# Patient Record
Sex: Female | Born: 1990 | Race: White | Hispanic: No | Marital: Single | Smoking: Never smoker
Health system: Southern US, Community
[De-identification: ages and names within clinical notes are randomized; demographics above are authoritative.]

## PROBLEM LIST (undated history)

## (undated) ENCOUNTER — Emergency Department (HOSPITAL_COMMUNITY)

## (undated) DIAGNOSIS — IMO0002 Reserved for concepts with insufficient information to code with codable children: Secondary | ICD-10-CM

## (undated) DIAGNOSIS — G43909 Migraine, unspecified, not intractable, without status migrainosus: Secondary | ICD-10-CM

## (undated) DIAGNOSIS — R160 Hepatomegaly, not elsewhere classified: Secondary | ICD-10-CM

## (undated) DIAGNOSIS — N200 Calculus of kidney: Secondary | ICD-10-CM

## (undated) HISTORY — DX: Migraine, unspecified, not intractable, without status migrainosus: G43.909

## (undated) HISTORY — DX: Reserved for concepts with insufficient information to code with codable children: IMO0002

## (undated) HISTORY — DX: Calculus of kidney: N20.0

## (undated) HISTORY — DX: Hepatomegaly, not elsewhere classified: R16.0

---
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
## 2012-01-19 ENCOUNTER — Emergency Department
Admission: EM | Admit: 2012-01-19 | Discharge: 2012-01-19 | Disposition: A | Discharge status: Home / Self Care | Payer: BC Managed Care – PPO | Source: Home / Self Care | Attending: Family Medicine | Admitting: Family Medicine

## 2012-01-19 ENCOUNTER — Encounter: Payer: Self-pay | Admitting: *Deleted

## 2012-01-19 DIAGNOSIS — N760 Acute vaginitis: Secondary | ICD-10-CM

## 2012-01-19 DIAGNOSIS — B9689 Other specified bacterial agents as the cause of diseases classified elsewhere: Secondary | ICD-10-CM

## 2012-01-19 DIAGNOSIS — N949 Unspecified condition associated with female genital organs and menstrual cycle: Secondary | ICD-10-CM

## 2012-01-19 DIAGNOSIS — A499 Bacterial infection, unspecified: Secondary | ICD-10-CM

## 2012-01-19 LAB — POCT URINALYSIS DIPSTICK
Bilirubin, UA: NEGATIVE
Blood, UA: NEGATIVE
Glucose, UA: NEGATIVE
Ketones, UA: NEGATIVE
Spec Grav, UA: 1.02 (ref 1.005–1.03)
Urobilinogen, UA: 0.2 (ref 0–1)

## 2012-01-19 LAB — POCT URINE PREGNANCY: Preg Test, Ur: NEGATIVE

## 2012-01-19 MED ORDER — METRONIDAZOLE 500 MG PO TABS: 500.0000 mg | ORAL_TABLET | Freq: Two times a day (BID) | ORAL | Status: AC

## 2012-01-19 MED ORDER — FLUCONAZOLE 150 MG PO TABS: ORAL_TABLET | ORAL | Status: DC

## 2012-01-19 NOTE — ED Provider Notes (Signed)
History     CSN: [id number]  Arrival date & time 01/19/12  1526   First MD Initiated Contact with Patient 01/19/12 1631      Chief Complaint  Patient presents with  . Foreign Body in Vagina      HPI Comments: Patient is concerned that she may have a retained tampon in vagina for two days.  Patient's last menstrual period was 01/17/2012.  She noted a small amount of vaginal discharge prior to period.  She denies abdominal or pelvic pain.  She has had no fever but had mild chills yesterday.  No rash.  No nausea/vomiting   The history is provided by the patient.    Past Medical History  Diagnosis Date  . Nephrolithiasis     History reviewed. No pertinent past surgical history.  Family History  Problem Relation Age of Onset  . Hypertension Mother     History  Substance Use Topics  . Smoking status: Never Smoker   . Smokeless tobacco: Never Used  . Alcohol Use: No    OB History    Grav Para Term Preterm Abortions TAB SAB Ect Mult Living   0 0              Review of Systems No sore throat No cough No pleuritic pain No wheezing No nasal congestion No post-nasal drainage No sinus pain/pressure No itchy/red eyes No earache No hemoptysis No SOB No fever, + chills yesterday No nausea No vomiting No abdominal pain No diarrhea No urinary symptoms No skin rashes No fatigue No myalgias No headache Slight vaginal discharge has been present  Allergies  Sulfonamide derivatives  Home Medications   Current Outpatient Rx  Name Route Sig Dispense Refill  . FLUCONAZOLE 150 MG PO TABS  Take one tab by mouth as a single dose 1 tablet 0  . METRONIDAZOLE 500 MG PO TABS Oral Take 1 tablet (500 mg total) by mouth 2 (two) times daily. 14 tablet 0  . NORGESTIMATE-ETH ESTRADIOL 0.25-35 MG-MCG PO TABS Oral Take 1 tablet by mouth daily. 1 Package 11    BP 110/72  Pulse 81  Temp(Src) 98.9 F (37.2 C) (Oral)  Resp 14  Ht 5\' 4"  (1.626 m)  Wt 147 lb (66.679 kg)  BMI  25.23 kg/m2  SpO2 99%  LMP 01/17/2012  Physical Exam Nursing notes and Vital Signs reviewed. Appearance:  Patient appears healthy, stated age, and in no acute distress Eyes:  Pupils are equal, round, and reactive to light and accomodation.  Extraocular movement is intact.  Conjunctivae are not inflamed  Pharynx:  Normal Neck:  Supple.  No adenopathy Lungs:  Clear to auscultation.  Breath sounds are equal.  Heart:  Regular rate and rhythm without murmurs, rubs, or gallops.  Abdomen:  Nontender without masses or hepatosplenomegaly.  Bowel sounds are present.  No CVA or flank tenderness.  Extremities:  No edema.   Skin:  No rash present. Genitourinary:  Vulva appears normal without lesions or erythema.  Vagina has normal mucosae without lesions.  There is no tampon or other foreign body within the vagina.  There is a small amount of thin white discharge in the vaginal vault.  Cervix appears normal without lesions.  There is a small amount of clear mucous present in the cervical os.  No cervical motion tenderness is present. Uterus is small and non-tender.  Adnexae are without masses.  Adnexae are slightly tender to manipulation.   ED Course  Procedures none   Labs  Reviewed  POCT URINALYSIS DIPSTICK negative  POCT URINE PREGNANCY negative  GC/CHLAMYDIA PROBE AMP, GENITAL pending  POCT WET + KOH PREP (DR PERFORMED @ [facility]) moderate clue cells, 5-7 WBC/HPF, few yeast cells      1. Bacterial vaginosis       MDM  No evidence toxic shock syndrome (note no foreign body in vagina) Begin Flagyl for one week.  Some branching yeast cells noted; will give Rx for Diflucan 150mg . GC/chlamydia pending. follow-up with PCP if not improving, or if symptoms worsen.        Donna Christen, MD 01/21/12 2117

## 2012-01-19 NOTE — Discharge Instructions (Signed)
Bacterial Vaginosis Bacterial vaginosis (BV) is a vaginal infection where the normal balance of bacteria in the vagina is disrupted. The normal balance is then replaced by an overgrowth of certain bacteria. There are several different kinds of bacteria that can cause BV. BV is the most common vaginal infection in women of childbearing age. CAUSES   The cause of BV is not fully understood. BV develops when there is an increase or imbalance of harmful bacteria.   Some activities or behaviors can upset the normal balance of bacteria in the vagina and put women at increased risk including:   Having a new sex partner or multiple sex partners.   Douching.   Using an intrauterine device (IUD) for contraception.   It is not clear what role sexual activity plays in the development of BV. However, women that have never had sexual intercourse are rarely infected with BV.  Women do not get BV from toilet seats, bedding, swimming pools or from touching objects around them.  SYMPTOMS   Grey vaginal discharge.   A fish-like odor with discharge, especially after sexual intercourse.   Itching or burning of the vagina and vulva.   Burning or pain with urination.   Some women have no signs or symptoms at all.  DIAGNOSIS  Your caregiver must examine the vagina for signs of BV. Your caregiver will perform lab tests and look at the sample of vaginal fluid through a microscope. They will look for bacteria and abnormal cells (clue cells), a pH test higher than 4.5, and a positive amine test all associated with BV.  RISKS AND COMPLICATIONS   Pelvic inflammatory disease (PID).   Infections following gynecology surgery.   Developing HIV.   Developing herpes virus.  TREATMENT  Sometimes BV will clear up without treatment. However, all women with symptoms of BV should be treated to avoid complications, especially if gynecology surgery is planned. Female partners generally do not need to be treated. However,  BV may spread between female sex partners so treatment is helpful in preventing a recurrence of BV.   BV may be treated with antibiotics. The antibiotics come in either pill or vaginal cream forms. Either can be used with nonpregnant or pregnant women, but the recommended dosages differ. These antibiotics are not harmful to the baby.   BV can recur after treatment. If this happens, a second round of antibiotics will often be prescribed.   Treatment is important for pregnant women. If not treated, BV can cause a premature delivery, especially for a pregnant woman who had a premature birth in the past. All pregnant women who have symptoms of BV should be checked and treated.   For chronic reoccurrence of BV, treatment with a type of prescribed gel vaginally twice a week is helpful.  HOME CARE INSTRUCTIONS   Finish all medication as directed by your caregiver.   Do not have sex until treatment is completed.   Tell your sexual partner that you have a vaginal infection. They should see their caregiver and be treated if they have problems, such as a mild rash or itching.   Practice safe sex. Use condoms. Only have 1 sex partner.  PREVENTION  Basic prevention steps can help reduce the risk of upsetting the natural balance of bacteria in the vagina and developing BV:  Do not have sexual intercourse (be abstinent).   Do not douche.   Use all of the medicine prescribed for treatment of BV, even if the signs and symptoms go away.     Tell your sex partner if you have BV. That way, they can be treated, if needed, to prevent reoccurrence.  SEEK MEDICAL CARE IF:   Your symptoms are not improving after 3 days of treatment.   You have increased discharge, pain, or fever.  MAKE SURE YOU:   Understand these instructions.   Will watch your condition.   Will get help right away if you are not doing well or get worse.  FOR MORE INFORMATION  Division of STD Prevention (DSTDP), Centers for Disease  Control and Prevention: www.cdc.gov/std American Social Health Association (ASHA): www.ashastd.org  Document Released: 11/15/2005 Document Revised: 07/28/2011 Document Reviewed: 05/08/2009 Exit[employer] Patient Information 2012 [employer]. 

## 2012-01-19 NOTE — ED Notes (Addendum)
Patient believes she may have a tampon in from 2 days ago. She started her cycle 2 days ago (01/17/12), she has not had much menstruating since. When she wipes "it looks like old blood". C/o mild pelvic cramping and clots in her urine.

## 2012-01-21 LAB — GC/CHLAMYDIA PROBE AMP, GENITAL
Chlamydia, DNA Probe: NEGATIVE
GC Probe Amp, Genital: NEGATIVE

## 2012-05-12 ENCOUNTER — Other Ambulatory Visit: Payer: Self-pay | Admitting: Family Medicine

## 2012-05-12 ENCOUNTER — Encounter: Payer: Self-pay | Admitting: Family Medicine

## 2012-05-12 ENCOUNTER — Ambulatory Visit (INDEPENDENT_AMBULATORY_CARE_PROVIDER_SITE_OTHER): Payer: BC Managed Care – PPO | Admitting: Family Medicine

## 2012-05-12 VITALS — BP 104/63 | HR 82 | Temp 98.1°F | Ht 61.0 in | Wt 145.0 lb

## 2012-05-12 DIAGNOSIS — L678 Other hair color and hair shaft abnormalities: Secondary | ICD-10-CM

## 2012-05-12 DIAGNOSIS — L738 Other specified follicular disorders: Secondary | ICD-10-CM

## 2012-05-12 DIAGNOSIS — L739 Follicular disorder, unspecified: Secondary | ICD-10-CM

## 2012-05-12 DIAGNOSIS — L0291 Cutaneous abscess, unspecified: Secondary | ICD-10-CM

## 2012-05-12 MED ORDER — MUPIROCIN 2 % EX OINT: TOPICAL_OINTMENT | Freq: Three times a day (TID) | CUTANEOUS | Status: AC

## 2012-05-12 MED ORDER — DOXYCYCLINE HYCLATE 100 MG PO TABS: 100.0000 mg | ORAL_TABLET | Freq: Two times a day (BID) | ORAL | Status: AC

## 2012-05-12 NOTE — Progress Notes (Signed)
  Subjective:    Patient ID: Sierra Mccullough, female    DOB: [birth year], 21 y.o.   MRN: [id number]  HPI Had 2 bumps that lasted 2 months.  Says hasn't shaved bc didn't want to spread them.  They popped by themselves and drained a lot of pus. Has 2 new one.  Noticed a new on on her hip, leg and inner labia. She does have a prior history of abscess and staph infections. She has not used Hibiclens and a long time. She has been applying mupirocin ointment to the lesions. She quits shaving her pubic area to try to reduce the number of bumps.   Review of Systems     Objective:   Physical Exam  Constitutional: She appears well-developed and well-nourished.  Musculoskeletal: She exhibits no edema.  Skin:       She has a folliculitis on her inner left knee. She has what looks like a healing abscess on her right anterior hip. She has a couple smaller lesions in the area that appear to be folliculitis. She also has 2 lesions towards the left of the mons pubis that appear to be healing abscess these. She has a new small abscess on the right inner labia. No evidence of cellulitis.          Assessment & Plan:  Folliculitis and small abscess these-will tx with doxy x 10 days. Discussed restarting the hibiclens washes daily for one week and then twice a week for maintenance.  Warm compresses or hot bath soaks. If lesion near the labia is not resolving come in and we can lance it.  Also discussed continuing the he appears an appointment as needed. I did collect a culture that it will likely be negative because she's been using mupirocin ointment on the lesions. We also discussed strategies including changing her razor more frequently even boiling in hot water for 5 minutes after each use. Avoiding using bath scrub her second set in the shower and gray bacteria. Make sure using a new washcloth and towel each time she bathes. Wearing cotton underwear to avoid the increased friction and sweating that happens with  synthetic materials. Also recommend she restart the Hibiclens.

## 2012-05-12 NOTE — Patient Instructions (Signed)

## 2012-05-16 LAB — WOUND CULTURE: Gram Stain: NONE SEEN

## 2012-09-15 ENCOUNTER — Ambulatory Visit: Payer: Self-pay

## 2012-09-18 ENCOUNTER — Ambulatory Visit (INDEPENDENT_AMBULATORY_CARE_PROVIDER_SITE_OTHER): Payer: BC Managed Care – PPO

## 2012-09-18 DIAGNOSIS — Z23 Encounter for immunization: Secondary | ICD-10-CM

## 2013-03-02 ENCOUNTER — Encounter: Payer: Self-pay | Admitting: Family Medicine

## 2013-03-02 ENCOUNTER — Ambulatory Visit (INDEPENDENT_AMBULATORY_CARE_PROVIDER_SITE_OTHER): Payer: BC Managed Care – PPO | Admitting: Family Medicine

## 2013-03-02 VITALS — BP 100/63 | HR 84 | Ht 64.0 in | Wt 160.0 lb

## 2013-03-02 DIAGNOSIS — L678 Other hair color and hair shaft abnormalities: Secondary | ICD-10-CM

## 2013-03-02 DIAGNOSIS — L738 Other specified follicular disorders: Secondary | ICD-10-CM

## 2013-03-02 DIAGNOSIS — L739 Follicular disorder, unspecified: Secondary | ICD-10-CM

## 2013-03-02 DIAGNOSIS — L0390 Cellulitis, unspecified: Secondary | ICD-10-CM

## 2013-03-02 MED ORDER — CEPHALEXIN 500 MG PO CAPS: 500.0000 mg | ORAL_CAPSULE | Freq: Three times a day (TID) | ORAL | Status: DC

## 2013-03-02 MED ORDER — MUPIROCIN 2 % EX OINT: TOPICAL_OINTMENT | Freq: Three times a day (TID) | CUTANEOUS | Status: DC

## 2013-03-02 NOTE — Patient Instructions (Addendum)
Call if not in a week.

## 2013-03-02 NOTE — Progress Notes (Signed)
  Subjective:    Patient ID: Sierra Mccullough, female    DOB: Jun 10, 1991, 22 y.o.   MRN: [id number]  HPI Abscess x 1 week.   Has several lesion on the right lower abdomen. No drainage.  No blisters.  Has about 6 lesions. No fever or chills. It is tender.    Review of Systems     Objective:   Physical Exam  Constitutional: She appears well-developed and well-nourished.  HENT:  Head: Normocephalic and atraumatic.  Skin: Skin is warm and dry.  Several erythematous lesion that are papular and 2 that  Look more ulcerate (though she says the Band-Aid did pull the skin off of those lesions) in the right lower groin crease.   Psychiatric: She has a normal mood and affect. Her behavior is normal.          Assessment & Plan:  Folliculitis with maybe early cellulitis. It actually clustered a most like a herpes virus but she says that they were not blistery. Right now I see no active drainage. Will treat with oral Keflex and topical Bactroban ointment as she does have multiple lesions. Call if not better in one week.

## 2013-04-08 ENCOUNTER — Encounter (HOSPITAL_BASED_OUTPATIENT_CLINIC_OR_DEPARTMENT_OTHER): Payer: Self-pay | Admitting: *Deleted

## 2013-04-08 ENCOUNTER — Emergency Department (HOSPITAL_BASED_OUTPATIENT_CLINIC_OR_DEPARTMENT_OTHER)
Admission: EM | Admit: 2013-04-08 | Discharge: 2013-04-08 | Disposition: A | Discharge status: Home / Self Care | Payer: BC Managed Care – PPO | Attending: Emergency Medicine | Admitting: Emergency Medicine

## 2013-04-08 DIAGNOSIS — Z792 Long term (current) use of antibiotics: Secondary | ICD-10-CM | POA: Insufficient documentation

## 2013-04-08 DIAGNOSIS — Z79899 Other long term (current) drug therapy: Secondary | ICD-10-CM | POA: Insufficient documentation

## 2013-04-08 DIAGNOSIS — L02211 Cutaneous abscess of abdominal wall: Secondary | ICD-10-CM

## 2013-04-08 DIAGNOSIS — Z87442 Personal history of urinary calculi: Secondary | ICD-10-CM | POA: Insufficient documentation

## 2013-04-08 DIAGNOSIS — L02219 Cutaneous abscess of trunk, unspecified: Secondary | ICD-10-CM | POA: Insufficient documentation

## 2013-04-08 MED ORDER — DOXYCYCLINE HYCLATE 100 MG PO TABS
100.0000 mg | ORAL_TABLET | Freq: Once | ORAL | Status: AC
  Administered 2013-04-08: 100 mg via ORAL
  Filled 2013-04-08: qty 1

## 2013-04-08 MED ORDER — DOXYCYCLINE HYCLATE 100 MG PO CAPS: 100.0000 mg | ORAL_CAPSULE | Freq: Two times a day (BID) | ORAL | Status: DC

## 2013-04-08 NOTE — ED Notes (Signed)
Pt states she has a raised reddened area below her umbilicus that has been there for a few weeks.

## 2013-04-08 NOTE — ED Provider Notes (Signed)
History     CSN: [id number]  Arrival date & time 04/08/13  0021   First MD Initiated Contact with Patient 04/08/13 0146      Chief Complaint  Patient presents with  . Abscess    (Consider location/radiation/quality/duration/timing/severity/associated sxs/prior treatment) HPI This is a 22 year old female with a tender, erythematous, swollen area inferior and to the left of her umbilicus. It began yesterday. There is moderate pain associated with it, worse with palpation. She has attempted to take Keflex for this but states that it upsets her stomach. She denies fever or chills. She does have a history of abscesses in the past.  Past Medical History  Diagnosis Date  . Nephrolithiasis     History reviewed. No pertinent past surgical history.  Family History  Problem Relation Age of Onset  . Hypertension Mother     History  Substance Use Topics  . Smoking status: Never Smoker   . Smokeless tobacco: Never Used  . Alcohol Use: No    OB History   Grav Para Term Preterm Abortions TAB SAB Ect Mult Living   0 0              Review of Systems  All other systems reviewed and are negative.    Allergies  Sulfonamide derivatives  Home Medications   Current Outpatient Rx  Name  Route  Sig  Dispense  Refill  . cephALEXin (KEFLEX) 500 MG capsule   Oral   Take 1 capsule (500 mg total) by mouth 3 (three) times daily.   30 capsule   0   . mupirocin ointment (BACTROBAN) 2 %   Topical   Apply topically 3 (three) times daily.   22 g   0     BP 132/68  Pulse 62  Temp(Src) 98.6 F (37 C) (Oral)  Resp 18  Ht 5\' 4"  (1.626 m)  Wt 160 lb (72.576 kg)  BMI 27.45 kg/m2  SpO2 98%  LMP 03/11/2013  Physical Exam General: Well-developed, well-nourished female in no acute distress; appearance consistent with age of record HENT: normocephalic, atraumatic Eyes: pupils equal round and reactive to light; extraocular muscles intact Neck: supple Heart: regular rate and  rhythm Lungs: clear to auscultation bilaterally Abdomen: soft; nondistended Extremities: No deformity; full range of motion; pulses normal Neurologic: Awake, alert and oriented; motor function intact in all extremities and symmetric; no facial droop Skin: Warm and dry; tender, erythematous, indurated lesion just inferior to the left of the umbilicus without fluctuance Psychiatric: Normal mood and affect    ED Course  Procedures (including critical care time)     MDM  1:56 AM Iodine not indicated at this time due to lack of a palpable pus pocket. We will treat with doxycycline and have her return in 48 hours for I&D if symptoms are worsening.        Hanley Seamen, MD 04/08/13 0157

## 2013-04-16 ENCOUNTER — Emergency Department (HOSPITAL_BASED_OUTPATIENT_CLINIC_OR_DEPARTMENT_OTHER)
Admission: EM | Admit: 2013-04-16 | Discharge: 2013-04-17 | Disposition: A | Discharge status: Home / Self Care | Payer: BC Managed Care – PPO | Attending: Emergency Medicine | Admitting: Emergency Medicine

## 2013-04-16 ENCOUNTER — Encounter (HOSPITAL_BASED_OUTPATIENT_CLINIC_OR_DEPARTMENT_OTHER): Payer: Self-pay | Admitting: *Deleted

## 2013-04-16 DIAGNOSIS — Z87442 Personal history of urinary calculi: Secondary | ICD-10-CM | POA: Insufficient documentation

## 2013-04-16 DIAGNOSIS — L0291 Cutaneous abscess, unspecified: Secondary | ICD-10-CM | POA: Insufficient documentation

## 2013-04-16 LAB — URINALYSIS, ROUTINE W REFLEX MICROSCOPIC
Glucose, UA: NEGATIVE mg/dL
Hgb urine dipstick: NEGATIVE
Ketones, ur: NEGATIVE mg/dL
pH: 6.5 (ref 5.0–8.0)

## 2013-04-16 LAB — URINE MICROSCOPIC-ADD ON

## 2013-04-16 MED ORDER — DOXYCYCLINE HYCLATE 100 MG PO CAPS: 100.0000 mg | ORAL_CAPSULE | Freq: Two times a day (BID) | ORAL | Status: DC

## 2013-04-16 MED ORDER — ONDANSETRON 4 MG PO TBDP: 4.0000 mg | ORAL_TABLET | Freq: Three times a day (TID) | ORAL | Status: DC | PRN

## 2013-04-16 MED ORDER — CEFTRIAXONE SODIUM 1 G IJ SOLR
1.0000 g | Freq: Once | INTRAMUSCULAR | Status: AC
  Administered 2013-04-16: 1 g via INTRAMUSCULAR
  Filled 2013-04-16: qty 10

## 2013-04-16 MED ORDER — CEPHALEXIN 500 MG PO CAPS: 500.0000 mg | ORAL_CAPSULE | Freq: Four times a day (QID) | ORAL | Status: DC

## 2013-04-16 MED ORDER — LIDOCAINE HCL (PF) 1 % IJ SOLN
INTRAMUSCULAR | Status: AC
  Administered 2013-04-16: 2.1 mL
  Filled 2013-04-16: qty 5

## 2013-04-16 MED ORDER — MUPIROCIN CALCIUM 2 % EX CREA: TOPICAL_CREAM | Freq: Three times a day (TID) | CUTANEOUS | Status: DC

## 2013-04-16 NOTE — ED Provider Notes (Signed)
History     CSN: [id number]  Arrival date & time 04/16/13  2106   First MD Initiated Contact with Patient 04/16/13 2229      Chief Complaint  Patient presents with  . Rash    (Consider location/radiation/quality/duration/timing/severity/associated sxs/prior treatment) Patient is a 22 y.o. female presenting with rash. The history is provided by the patient. No language interpreter was used.  Rash Location: abdomen. Quality: draining, painful and redness   Pain details:    Quality:  Aching Severity:  Moderate Onset quality:  Gradual Timing:  Constant Progression:  Worsening Chronicity:  New Relieved by:  Nothing Ineffective treatments:  None tried Pt complains of a persistant abscessed area to mid abdomen.  Pt stuck a pin in and drained today.  Area is open and draining.  Pt has only been taking one doxycycline a day  Past Medical History  Diagnosis Date  . Nephrolithiasis     History reviewed. No pertinent past surgical history.  Family History  Problem Relation Age of Onset  . Hypertension Mother     History  Substance Use Topics  . Smoking status: Never Smoker   . Smokeless tobacco: Never Used  . Alcohol Use: No    OB History   Grav Para Term Preterm Abortions TAB SAB Ect Mult Living   0 0              Review of Systems  Skin: Positive for wound.  All other systems reviewed and are negative.    Allergies  Sulfonamide derivatives  Home Medications   Current Outpatient Rx  Name  Route  Sig  Dispense  Refill  . doxycycline (VIBRAMYCIN) 100 MG capsule   Oral   Take 1 capsule (100 mg total) by mouth 2 (two) times daily. One po bid x 7 days   14 capsule   0   . mupirocin ointment (BACTROBAN) 2 %   Topical   Apply topically 3 (three) times daily.   22 g   0   . cephALEXin (KEFLEX) 500 MG capsule   Oral   Take 1 capsule (500 mg total) by mouth 3 (three) times daily.   30 capsule   0     BP 130/66  Pulse 80  Temp(Src) 98.7 F (37.1 C)  (Oral)  Resp 20  Ht 5\' 4"  (1.626 m)  Wt 160 lb (72.576 kg)  BMI 27.45 kg/m2  SpO2 100%  LMP 03/11/2013  Physical Exam  Nursing note and vitals reviewed. Constitutional: She is oriented to person, place, and time. She appears well-developed and well-nourished.  HENT:  Head: Normocephalic.  Musculoskeletal: Normal range of motion.  Neurological: She is alert and oriented to person, place, and time. She has normal reflexes.  Draining abscess mid abdomen,  2cm area of erythema  Skin: There is erythema.  Psychiatric: She has a normal mood and affect.    ED Course  Procedures (including critical care time)  Labs Reviewed  URINALYSIS, ROUTINE W REFLEX MICROSCOPIC - Abnormal; Notable for the following:    Leukocytes, UA MODERATE (*)    All other components within normal limits  URINE MICROSCOPIC-ADD ON - Abnormal; Notable for the following:    Bacteria, UA FEW (*)    All other components within normal limits  URINE CULTURE   No results found.   No diagnosis found.    MDM  Rocephin Im.   Pt given rx for keflex, doxycycline and zofran.   Pt advised warm compresses.  See primary  for recheck in 2 days        Elson Areas, [place] 04/16/13 2319

## 2013-04-16 NOTE — ED Notes (Signed)
Pt here for rash to abdominal area, was seen here for same thing on 5/11

## 2013-04-16 NOTE — ED Notes (Signed)
PA at bedside.

## 2013-04-17 NOTE — ED Provider Notes (Signed)
Medical screening examination/treatment/procedure(s) were performed by non-physician practitioner and as supervising physician I was immediately available for consultation/collaboration.   Carleene Cooper III, MD 04/17/13 1409

## 2013-04-18 LAB — URINE CULTURE
Colony Count: NO GROWTH
Culture: NO GROWTH

## 2013-04-19 ENCOUNTER — Encounter: Payer: Self-pay | Admitting: Obstetrics and Gynecology

## 2013-04-19 ENCOUNTER — Ambulatory Visit (INDEPENDENT_AMBULATORY_CARE_PROVIDER_SITE_OTHER): Payer: BC Managed Care – PPO | Admitting: Obstetrics and Gynecology

## 2013-04-19 VITALS — BP 100/60 | Ht 63.5 in | Wt 155.0 lb

## 2013-04-19 DIAGNOSIS — L0291 Cutaneous abscess, unspecified: Secondary | ICD-10-CM

## 2013-04-19 DIAGNOSIS — Z113 Encounter for screening for infections with a predominantly sexual mode of transmission: Secondary | ICD-10-CM

## 2013-04-19 DIAGNOSIS — N946 Dysmenorrhea, unspecified: Secondary | ICD-10-CM

## 2013-04-19 DIAGNOSIS — Z01419 Encounter for gynecological examination (general) (routine) without abnormal findings: Secondary | ICD-10-CM

## 2013-04-19 DIAGNOSIS — L0390 Cellulitis, unspecified: Secondary | ICD-10-CM

## 2013-04-19 DIAGNOSIS — N943 Premenstrual tension syndrome: Secondary | ICD-10-CM

## 2013-04-19 MED ORDER — IBUPROFEN 800 MG PO TABS: 800.0000 mg | ORAL_TABLET | Freq: Three times a day (TID) | ORAL | Status: DC | PRN

## 2013-04-19 MED ORDER — NORETHIN-ETH ESTRAD-FE BIPHAS 1 MG-10 MCG / 10 MCG PO TABS: 1.0000 | ORAL_TABLET | Freq: Every day | ORAL | Status: DC

## 2013-04-19 NOTE — Progress Notes (Signed)
Patient ID: Sierra Mccullough, female   DOB: 1991/11/28, 22 y.o.   MRN: [id number]  22 y.o.   Single    Caucasian   female   G0P0   here for annual exam.   Patient has a history of painful menses.  Takes 800 mg ibuprofen 2 - 3 times a day for cramping.  Pain during intercourse.    Feels down during premenstrual period of time, worse recently.  Denis suicidal or homicidal ideation.  Took OCPs in the past.  Had hand swelling and nausea so stopped it.    History of migraines without aura.  Topamax caused kidney stones so stopped it.  Also has headaches with menses.    Taking Doxycycline for skin infection on abdomen. Seen at [facility] in [facility].  No culture.  Though it may be MRSA. Patient had greenish vaginal discharge after antibiotic therapy.  No itching.  Discharge resolved completely.    Patient's last menstrual period was 04/05/2013.          Sexually active: yes  The current method of family planning is condoms Exercising: cardio and free weights Last tetanus - about one year ago.     Family History  Problem Relation Age of Onset  . Hypertension Mother   . Diabetes Mother   . Migraines Mother   . Diabetes Maternal Grandfather     Patient Active Problem List   Diagnosis Date Noted  . History of nephrolithiasis 11/10/2011  . Menorrhagia 11/10/2011  . ABSCESS, SKIN 01/29/2011  . SYNCOPE 09/21/2010    Past Medical History  Diagnosis Date  . Nephrolithiasis   . Anxiety   . Depression   . Dyspareunia   . Migraines     Past Surgical History  Procedure Laterality Date  . Urethral stent  2011    due to kidney stones    Allergies: Keflex and Sulfonamide derivatives  Current Outpatient Prescriptions  Medication Sig Dispense Refill  . doxycycline (VIBRAMYCIN) 100 MG capsule Take 1 capsule (100 mg total) by mouth 2 (two) times daily.  20 capsule  0  . mupirocin cream (BACTROBAN) 2 % Apply topically 3 (three) times daily.  15 g  0  . ondansetron (ZOFRAN ODT) 4  MG disintegrating tablet Take 1 tablet (4 mg total) by mouth every 8 (eight) hours as needed for nausea.  20 tablet  0  . cephALEXin (KEFLEX) 500 MG capsule Take 1 capsule (500 mg total) by mouth 3 (three) times daily.  30 capsule  0  . cephALEXin (KEFLEX) 500 MG capsule Take 1 capsule (500 mg total) by mouth 4 (four) times daily.  28 capsule  0  . doxycycline (VIBRAMYCIN) 100 MG capsule Take 1 capsule (100 mg total) by mouth 2 (two) times daily. One po bid x 7 days  14 capsule  0  . mupirocin ointment (BACTROBAN) 2 % Apply topically 3 (three) times daily.  22 g  0   No current facility-administered medications for this visit.    ROS: Pertinent items are noted in HPI.  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  Exam:    BP 100/60  Ht 5' 3.5" (1.613 m)  Wt 155 lb (70.308 kg)  BMI 27.02 kg/m2  LMP 04/05/2013   Wt Readings from Last 3 Encounters:  04/19/13 155 lb (70.308 kg) (84%*, Z = 1.00)  04/16/13 160 lb (72.576 kg) (87%*, Z = 1.14)  04/08/13 160 lb (72.576 kg) (87%*, Z =  1.15)   * Growth percentiles are based on CDC 2-20 Years data.     Ht Readings from Last 3 Encounters:  04/19/13 5' 3.5" (1.613 m) (38%*, Z = -0.31)  04/16/13 5\' 4"  (1.626 m) (46%*, Z = -0.11)  04/08/13 5\' 4"  (1.626 m) (46%*, Z = -0.11)   * Growth percentiles are based on CDC 2-20 Years data.    General appearance: alert, cooperative and appears stated age Head: Normocephalic, without obvious abnormality, atraumatic Neck: no adenopathy, supple, symmetrical, trachea midline and thyroid not enlarged, symmetric, no tenderness/mass/nodules Lungs: clear to auscultation bilaterally Breasts: Inspection negative, No nipple retraction or dimpling, No nipple discharge or bleeding, No axillary or supraclavicular adenopathy, Normal to palpation without dominant masses Heart: regular rate and rhythm Abdomen: soft, non-tender;  no masses,  no organomegaly Extremities: extremities normal, atraumatic,  no cyanosis or edema Skin: Skin color, texture, turgor normal.  Small open wound - resolving abscess on midline abdominal wall skin - area 7 mm.  Weeping.  Surrounded by a small area of erythema. Lymph nodes: Cervical, supraclavicular, and axillary nodes normal. No abnormal inguinal nodes palpated Neurologic: Grossly normal   Pelvic: External genitalia:  no lesions              Urethra:  normal appearing urethra with no masses, tenderness or lesions              Bartholins and Skenes: normal                 Vagina: normal appearing vagina with normal color and discharge, no lesions              Cervix: normal appearance              Pap taken: no        Bimanual Exam:  Uterus:  uterus is normal size, shape, consistency and nontender                                      Adnexa: normal adnexa in size, nontender and no masses                                      Rectovaginal: Confirms                                      Anus:  normal sphincter tone, no lesions  A: normal gynecologic exam Skin abscess.  On doxycycline now, 2nd course. Menstrual headaches Dysmenorrhea Need for STD testing PMS Need for reliable contraception     P: mammogram age 14 Wound culture sent Self breast exam taught Start LoloEstrin continuously for 9 weeks, then have withdrawal.  Will observe for headaches.  Proper use, risks and benefits discussed. Ibuprofen 800 mg Rx.  See EPIC orders. GC/CT, HIV, RPR, Hep C aby, HBsAg Condoms for STD prevention Discussed Gardasil vaccine.  Patient will consider. pap smear age 22 years old Follow up in 3 months.  return annually or prn     An After Visit Summary was printed and given to the patient.

## 2013-04-19 NOTE — Patient Instructions (Addendum)
EXERCISE AND DIET:  We recommended that you start or continue a regular exercise program for good health. Regular exercise means any activity that makes your heart beat faster and makes you sweat.  We recommend exercising at least 30 minutes per day at least 3 days a week, preferably 4 or 5.  We also recommend a diet low in fat and sugar.  Inactivity, poor dietary choices and obesity can cause diabetes, heart attack, stroke, and kidney damage, among others.    ALCOHOL AND SMOKING:  Women should limit their alcohol intake to no more than 7 drinks/beers/glasses of wine (combined, not each!) per week. Moderation of alcohol intake to this level decreases your risk of breast cancer and liver damage. And of course, no recreational drugs are part of a healthy lifestyle.  And absolutely no smoking or even second hand smoke. Most people know smoking can cause heart and lung diseases, but did you know it also contributes to weakening of your bones? Aging of your skin?  Yellowing of your teeth and nails?  CALCIUM AND VITAMIN D:  Adequate intake of calcium and Vitamin D are recommended.  The recommendations for exact amounts of these supplements seem to change often, but generally speaking 600 mg of calcium (either carbonate or citrate) and 800 units of Vitamin D per day seems prudent. Certain women may benefit from higher intake of Vitamin D.  If you are among these women, your doctor will have told you during your visit.    PAP SMEARS:  Pap smears, to check for cervical cancer or precancers,  have traditionally been done yearly, although recent scientific advances have shown that most women can have pap smears less often.  However, every woman still should have a physical exam from her gynecologist every year. It will include a breast check, inspection of the vulva and vagina to check for abnormal growths or skin changes, a visual exam of the cervix, and then an exam to evaluate the size and shape of the uterus and  ovaries.  And after 20 years of age, a rectal exam is indicated to check for rectal cancers. We will also provide age appropriate advice regarding health maintenance, like when you should have certain vaccines, screening for sexually transmitted diseases, bone density testing, colonoscopy, mammograms, etc.   MAMMOGRAMS:  All women over 20 years old should have a yearly mammogram. Many facilities now offer a "3D" mammogram, which may cost around $50 extra out of pocket. If possible,  we recommend you accept the option to have the 3D mammogram performed.  It both reduces the number of women who will be called back for extra views which then turn out to be normal, and it is better than the routine mammogram at detecting truly abnormal areas.    COLONOSCOPY:  Colonoscopy to screen for colon cancer is recommended for all women at age 20.  We know, you hate the idea of the prep.  We agree, BUT, having colon cancer and not knowing it is worse!!  Colon cancer so often starts as a polyp that can be seen and removed at colonscopy, which can quite literally save your life!  And if your first colonoscopy is normal and you have no family history of colon cancer, most women don't have to have it again for 10 years.  Once every ten years, you can do something that may end up saving your life, right?  We will be happy to help you get it scheduled when you are ready.    Be sure to check your insurance coverage so you understand how much it will cost.  It may be covered as a preventative service at no cost, but you should check your particular policy.     Oral Contraception Information Oral contraceptives (OCs) are medicines taken to prevent pregnancy. OCs work by preventing the ovaries from releasing eggs. The hormones in OCs also cause the cervical mucus to thicken, preventing the sperm from entering the uterus. The hormones also cause the uterine lining to become thin, not allowing a fertilized egg to attach to the inside of  the uterus. OCs are highly effective when taken exactly as prescribed. However, OCs do not prevent sexually transmitted diseases (STDs). Safe sex practices, such as using condoms along with the pill, can help prevent STDs.  Before taking the pill, you may have a physical exam and Pap test. Your caregiver may order blood tests that may be necessary. Your caregiver will make sure you are a good candidate for oral contraception. Discuss with your caregiver the possible side effects of the OC you may be prescribed. When starting an OC, it can take 2 to 3 months for the body to adjust to the changes in hormone levels in your body.  TYPES OF ORAL CONTRACEPTION  The combination pill. This pill contains estrogen and progestin (synthetic progesterone) hormones. The combination pill comes in either 21-day or 28-day packs. With 21-day packs, you do not take pills for 7 days after the last pill. With 28-day packs, the pill is taken every day. The last 7 pills are without hormones. Certain types of pills have more than 21 hormone-containing pills.  The minipill. This pill contains the progesterone hormone only. It is taken every day continuously. The minipill comes in packs of 91 pills. The first 84 pills contain the hormones, and the last 7 pills do not. The last 7 days are when you will have your menstrual period. You may experience irregular spotting. ADVANTAGES  Decreases premenstrual symptoms.  Treats menstrual period cramps.  Regulates the menstrual cycle.  Decreases a heavy menstrual flow.  Treats acne.  Treats abnormal uterine bleeding.  Treats chronic pelvic pain.  Treats polycystic ovarian syndrome.  Treats endometriosis.  Can be used as emergency contraception. DISADVANTAGES OCs can be less effective if:  You forget to take the pill at the same time every day.  You have a stomach or intestinal disease that lessens the absorption of the pill.  You take OCs with other medicines that  make OCs less effective.  You take expired OCs.  You forget to restart the pill on day 7, when using the packs of 21 pills. Document Released: 02/05/2003 Document Revised: 02/07/2012 Document Reviewed: 03/24/2011 [facility] Patient Information 2014 [place], [place]. HPV Vaccine Questions and Answers WHAT IS HUMAN PAPILLOMAVIRUS (HPV)? HPV is a virus that can lead to cervical cancer; vulvar and vaginal cancers; penile cancer; anal cancer and genital warts (warts in the genital areas). More than 1 vaccine is available to help you or your child with protection against HPV. Your caregiver can talk to you about which one might give you the best protection. WHO SHOULD GET THIS VACCINE? The HPV vaccine is most effective when given before the onset of sexual activity.  This vaccine is recommended for girls 75 or 20 years of age. It can be given to girls as young as 20 years old.  HPV vaccine can be given to males, 9 through 20 years of age, to reduce the likelihood of acquiring  genital warts.  HPV vaccine can be given to males and females aged 44 through 26 years to prevent anal cancer. HPV vaccine is not generally recommended after age 37, because most individuals have been exposed to the HPV virus by that age. HOW EFFECTIVE IS THIS VACCINE?  The vaccine is generally effective in preventing cervical; vulvar and vaginal cancers; penile cancer; anal cancer and genital warts caused by 4 types of HPV. The vaccine is less effective in those individuals who are already infected with HPV. This vaccine does not treat existing HPV, genital warts, pre-cancers or cancers. WILL SEXUALLY ACTIVE INDIVIDUALS BENEFIT FROM THE VACCINE? Sexually active individuals may still benefit from the vaccine but may get less benefit due to previous HPV exposure. HOW AND WHEN IS THE VACCINE ADMINISTERED? The vaccine is given in a series of 3 injections (shots) over a 6 month period in both males and females. The exact timing  depends on which specific vaccine your caregiver recommends for you. IS THE HPV VACCINE SAFE?  The federal government has approved the HPV vaccine as safe and effective. This vaccine was tested in both males and females in many countries around the world. The most common side effect is soreness at the injection site. Since the drug became approved, there has been some concern about patients passing out after being vaccinated, which has led to a recommendation of a 15 minute waiting period following vaccination. This practice may decrease the small risk of passing out. Additionally there is a rare risk of anaphylaxis (an allergic reaction) to the vaccine and a risk of a blood clot among individuals with specific risk factors for a blood clot. DOES THIS VACCINE CONTAIN THIMEROSAL OR MERCURY? No. There is no thimerosal or mercury in the HPV vaccine. It is made of proteins from the outer coat of the virus (HPV). There is no infectious material in this vaccine. WILL GIRLS/WOMEN WHO HAVE BEEN VACCINATED STILL NEED CERVICAL CANCER SCREENING? Yes. There are 3 reasons why women will still need regular cervical cancer screening. First, the vaccine will NOT provide protection against all types of HPV that cause cervical cancer. Vaccinated women will still be at risk for some cancers. Second, some women may not get all required doses of the vaccine (or they may not get them at the recommended times). Therefore, they may not get the vaccine's full benefits. Third, women may not get the full benefit of the vaccine if they receive it after they have already acquired any of the 4 types of HPV. WILL THE HPV VACCINE BE COVERED BY INSURANCE PLANS? While some insurance companies may cover the vaccine, others may not. Most large group insurance plans cover the costs of recommended vaccines. WHAT KIND OF GOVERNMENT PROGRAMS MAY BE AVAILABLE TO COVER HPV VACCINE? Federal health programs such as Vaccines for Children [facility]) will  cover the HPV vaccine. The [facility] program provides free vaccines to children and adolescents under 29 years of age, who are either uninsured, Medicaid-eligible, American [place] or [place] Native. There are over 45,000 sites that provide [facility] vaccines including hospital, private and public clinics. The [facility] program also allows children and adolescents to get VFC vaccines through [facility] or [facility] if their private health insurance does not cover the vaccine. Some states also provide free or low-cost vaccines, at public health clinics, to people without health insurance coverage for vaccines. GENITAL HPV: WHY IS HPV IMPORTANT? Genital HPV is the most common virus transmitted through genital contact, most  often during vaginal and anal sex. About 40 types of HPV can infect the genital areas of men and women. While most HPV types cause no symptoms and go away on their own, some types can cause cervical cancer in women. These types also cause other less common genital cancers, including cancers of the penis, anus, vagina (birth canal), and vulva (area around the opening of the vagina). Other types of HPV can cause genital warts in men and women. HOW COMMON IS HPV?   At least 50% of sexually active people will get HPV at some time in their lives. HPV is most common in young women and men who are in their late teens and early 72s.  Anyone who has ever had genital contact with another person can get HPV. Both men and women can get it and pass it on to their sex partners without realizing it. IS HPV THE SAME THING AS HIV OR HERPES? HPV is NOT the same as HIV or Herpes (Herpes simplex virus or HSV). While these are all viruses that can be sexually transmitted, HIV and HSV do not cause the same symptoms or health problems as HPV. CAN HPV AND ITS ASSOCIATED DISEASES BE TREATED? There is no treatment for HPV. There are treatments for the health problems that HPV can cause, such as  genital warts, cervical cell changes, and cancers of the cervix (lower part of the womb), vulva, vagina and anus.  HOW IS HPV RELATED TO CERVICAL CANCER? Some types of HPV can infect a woman's cervix and cause the cells to change in an abnormal way. Most of the time, HPV goes away on its own. When HPV is gone, the cervical cells go back to normal. Sometimes, HPV does not go away. Instead, it lingers (persists) and continues to change the cells on a woman's cervix. These cell changes can lead to cancer over time if they are not treated. ARE THERE OTHER WAYS TO PREVENT CERVICAL CANCER? Regular Pap tests and follow-up can prevent most, but not all, cases of cervical cancer. Pap tests can detect cell changes (or pre-cancers) in the cervix before they turn into cancer. Pap tests can also detect most, but not all, cervical cancers at an early, curable stage. Most women diagnosed with cervical cancer have either never had a Pap test, or not had a Pap test in the last 5 years. There is also an HPV DNA test available for use with the Pap test as part of cervical cancer screening. This test may be ordered for women over 30 or for women who get an unclear (borderline) Pap test result. While this test can tell if a woman has HPV on her cervix, it cannot tell which types of HPV she has. If the HPV DNA test is negative for HPV DNA, then screening may be done every 3 years. If the HPV DNA test is positive for HPV DNA, then screening should be done every 6 to 12 months. OTHER QUESTIONS ABOUT THE HPV VACCINE WHAT HPV TYPES DOES THE VACCINE PROTECT AGAINST? The HPV vaccine protects against the HPV types that cause most (70%) cervical cancers (types 16 and 18), most (78%) anal cancers (types 16 and 18) and the two HPV types that cause most (90%) genital warts (types 6 and 11). WHAT DOES THE VACCINE NOT PROTECT AGAINST?  Because the vaccine does not protect against all types of HPV, it will not prevent all cases of cervical  cancer, anal cancer, other genital cancers or genital warts. About  30% of cervical cancers are not prevented with vaccination, so it will be important for women to continue screening for cervical cancer (regular Pap tests). Also, the vaccine does not prevent about 10% of genital warts nor will it prevent other sexually transmitted infections (STIs), including HIV. Therefore, it will still be important for sexually active adults to practice safe sex to reduce exposure to HPV and other STI's. HOW LONG DOES VACCINE PROTECTION LAST? WILL A BOOSTER SHOT BE NEEDED? So far, studies have followed women for 5 years and found that they are still protected. Currently, additional (booster) doses are not recommended. More research is being done to find out how long protection will last, and if a booster vaccine is needed years later.  WHY IS THE HPV VACCINE RECOMMENDED AT SUCH A YOUNG AGE? Ideally, males and females should get the vaccine before they are sexually active since this vaccine is most effective in individuals who have not yet acquired any of the HPV vaccine types. Individuals who have not been infected with any of the 4 types of HPV will get the full benefits of the vaccine.  SHOULD PREGNANT WOMEN BE VACCINATED? The vaccine is not recommended for pregnant women. There has been limited research looking at vaccine safety for pregnant women and their developing fetus. Studies suggest that the vaccine has not caused health problems during pregnancy, nor has it caused health problems for the infant. Pregnant women should complete their pregnancy before getting the vaccine. If a woman finds out she is pregnant after she has started getting the vaccine series, she should complete her pregnancy before finishing the 3 doses. SHOULD BREASTFEEDING MOTHERS BE VACCINATED? Mothers nursing their babies may get the vaccine because the virus is inactivated and will not harm the mother or baby. WILL INDIVIDUALS BE PROTECTED  AGAINST HPV AND RELATED DISEASES, EVEN IF THEY DO NOT GET ALL 3 DOSES? It is not yet known how much protection individuals will get from receiving only 1 or 2 doses of the vaccine. For this reason, it is very important that individuals get all 3 doses of the vaccine. WILL CHILDREN BE REQUIRED TO BE VACCINATED TO ENTER SCHOOL? There are no federal laws that require children or adolescents to get vaccinated. All school entry laws are state laws so they vary from state to state. To find out what vaccines are needed for children or adolescents to enter school in your state, check with your state health department or board of education. ARE THERE OTHER WAYS TO PREVENT HPV? The only sure way to prevent HPV is to abstain from all sexual activity. Sexually active adults can reduce their risk by being in a mutually monogamous relationship with someone who has had no other sex partners. But even individuals with only 1 lifetime sex partner can get HPV, if their partner has had a previous partner with HPV. It is unknown how much protection condoms provide against HPV, since areas that are not covered by a condom can be exposed to the virus. However, condoms may reduce the risk of genital warts and cervical cancer. They can also reduce the risk of HIV and some other sexually transmitted infections (STIs), when used consistently and correctly (all the time and the right way). Document Released: 11/15/2005 Document Revised: 02/07/2012 Document Reviewed: 07/11/2009 [facility] Patient Information 2014 [place], [place].

## 2013-04-20 LAB — GC/CHLAMYDIA PROBE AMP, URINE
Chlamydia, Swab/Urine, PCR: NEGATIVE
GC Probe Amp, Urine: NEGATIVE

## 2013-04-20 LAB — STD PANEL
HIV: NONREACTIVE
Hepatitis B Surface Ag: NEGATIVE

## 2013-04-25 ENCOUNTER — Telehealth: Payer: Self-pay

## 2013-04-25 NOTE — Telephone Encounter (Signed)
Patient notified wound culture negative.

## 2013-04-25 NOTE — Telephone Encounter (Signed)
LMOVM to call for test results. 

## 2013-04-25 NOTE — Telephone Encounter (Signed)
Message copied by Alphonsa Overall on Wed Apr 25, 2013 10:53 AM ------      Message from: Conley Simmonds      Created: Tue Apr 24, 2013  7:56 AM       Please report negative wound culture to patient.      Has already been on antibiotics for several days, so this is not unexpected.  At least MRSA did not grow. ------

## 2013-07-20 ENCOUNTER — Ambulatory Visit: Payer: Self-pay | Admitting: Obstetrics and Gynecology

## 2013-08-10 ENCOUNTER — Telehealth: Payer: Self-pay

## 2013-08-10 ENCOUNTER — Encounter: Payer: Self-pay | Admitting: *Deleted

## 2013-08-10 ENCOUNTER — Ambulatory Visit (INDEPENDENT_AMBULATORY_CARE_PROVIDER_SITE_OTHER): Payer: BC Managed Care – PPO | Admitting: Family Medicine

## 2013-08-10 VITALS — BP 109/72 | HR 70 | Wt 159.0 lb

## 2013-08-10 DIAGNOSIS — Z111 Encounter for screening for respiratory tuberculosis: Secondary | ICD-10-CM

## 2013-08-10 DIAGNOSIS — Z23 Encounter for immunization: Secondary | ICD-10-CM

## 2013-08-10 NOTE — Telephone Encounter (Signed)
error 

## 2013-08-10 NOTE — Progress Notes (Signed)
Patient was given influenza vaccine left deltoid and TB skin test was given left lower arm.  She also left a form to be completed. Form is in your basket. Rhonda Cunningham,CMA

## 2013-08-13 LAB — TB SKIN TEST: Induration: 0 mm

## 2013-08-17 ENCOUNTER — Ambulatory Visit (INDEPENDENT_AMBULATORY_CARE_PROVIDER_SITE_OTHER): Payer: BC Managed Care – PPO | Admitting: Physician Assistant

## 2013-08-17 ENCOUNTER — Encounter: Payer: Self-pay | Admitting: Physician Assistant

## 2013-08-17 VITALS — BP 109/58 | HR 85 | Wt 158.0 lb

## 2013-08-17 DIAGNOSIS — Z111 Encounter for screening for respiratory tuberculosis: Secondary | ICD-10-CM

## 2013-08-17 DIAGNOSIS — Z0184 Encounter for antibody response examination: Secondary | ICD-10-CM

## 2013-08-17 NOTE — Progress Notes (Signed)
  Subjective:    Patient ID: Sierra Mccullough, female    DOB: 12-Sep-1991, 22 y.o.   MRN: [id number]  HPI Pt presents to the clinic to get forms filled out for school and to make sure all immunizations are up to date. No problems or concerns.    Review of Systems     Objective:   Physical Exam        Assessment & Plan:  Immunity status- Ordered titer for varicella. PPD 2nd test given today. Will have read on Monday. All other vaccines confirmed. Paperwork signed.

## 2013-08-18 LAB — VARICELLA ZOSTER ANTIBODY, IGG: Varicella IgG: 201.1 Index — ABNORMAL HIGH (ref <135.00)

## 2013-08-20 LAB — TB SKIN TEST
Induration: 0 mm
TB Skin Test: NEGATIVE

## 2013-09-06 ENCOUNTER — Ambulatory Visit: Payer: Self-pay | Admitting: Sports Medicine

## 2013-09-07 ENCOUNTER — Ambulatory Visit (INDEPENDENT_AMBULATORY_CARE_PROVIDER_SITE_OTHER): Payer: BC Managed Care – PPO

## 2013-09-07 ENCOUNTER — Encounter: Payer: Self-pay | Admitting: Sports Medicine

## 2013-09-07 ENCOUNTER — Ambulatory Visit (INDEPENDENT_AMBULATORY_CARE_PROVIDER_SITE_OTHER): Payer: BC Managed Care – PPO | Admitting: Sports Medicine

## 2013-09-07 VITALS — BP 114/72 | HR 73 | Wt 163.0 lb

## 2013-09-07 DIAGNOSIS — M25519 Pain in unspecified shoulder: Secondary | ICD-10-CM

## 2013-09-07 DIAGNOSIS — M25512 Pain in left shoulder: Secondary | ICD-10-CM

## 2013-09-07 MED ORDER — NAPROXEN 500 MG PO TABS: 500.0000 mg | ORAL_TABLET | Freq: Two times a day (BID) | ORAL | Status: DC

## 2013-09-07 NOTE — Assessment & Plan Note (Signed)
Symptoms clinically represents shoulder bursitis/rotator cuff tendinitis. This is referrable predominately to the subscapularis. We will start conservatively with x-rays, NSAIDs, rotator cuff exercises. Return in one month, consider injection if no better.

## 2013-09-07 NOTE — Progress Notes (Signed)
  Subjective:    CC: Left shoulder pain  HPI: This is a pleasant, otherwise healthy 22 year old female with left shoulder pain that began three days ago. The pain is constant and worsens when lifting her arms above her shoulder and when reaching backward. It occasionally radiates to the neck. It does not radiate into her arm or fingers. It interferes with her sleep. She denies any recent trauma. She has found mild relief with ice and Tylenol.  Moderate, persistent.  Past medical history, Surgical history, Family history not pertinant except as noted below, Social history, Allergies, and medications have been entered into the medical record, reviewed, and no changes needed.   Review of Systems: No headache, visual changes, nausea, vomiting, diarrhea, constipation, dizziness, abdominal pain, skin rash, fevers, chills, night sweats, swollen lymph nodes, weight loss, chest pain, body aches, joint swelling, muscle aches, shortness of breath, mood changes, visual or auditory hallucinations.  Objective:    General: Well Developed, well nourished, and in no acute distress.  Neuro: Alert and oriented x3, extra-ocular muscles intact, sensation grossly intact.  HEENT: Normocephalic, atraumatic, pupils equal round reactive to light, neck supple, no masses, no lymphadenopathy, thyroid nonpalpable.  Skin: Warm and dry, no rashes noted.  Cardiac: Regular rate and rhythm, no murmurs rubs or gallops.  Respiratory: Clear to auscultation bilaterally. Not using accessory muscles, speaking in full sentences.  Abdominal: Soft, nontender, nondistended, positive bowel sounds, no masses, no organomegaly. . Left Shoulder: Inspection reveals no abnormalities, atrophy or asymmetry. Tender to palpation lateral to the coracoid process. ROM is full in all planes. Rotator cuff strength normal throughout. Positive Neer and Hawkin's tests, empty can sign. Speeds and Yergason's tests normal. No labral pathology noted with  negative Obrien's, negative clunk and good stability. Normal scapular function observed. No painful arc and no drop arm sign. No apprehension sign  3 views of the right shoulder were reviewed personally, they are negative.  Impression and Recommendations:    The patient was counselled, risk factors were discussed, anticipatory guidance given.  Assessment: This is a 22 year old female with rotator cuff tendinitis.  Plan: 1. Complete home physical therapy daily 2. 800mg  Ibuprofen up to 3 times daily for pain 3. Return for glucocorticoid injection in 3 weeks if pain not improved   This note was originally written by Karin Lieu MS3

## 2013-10-05 ENCOUNTER — Ambulatory Visit: Payer: Self-pay | Admitting: Sports Medicine

## 2014-04-23 ENCOUNTER — Other Ambulatory Visit: Payer: Self-pay | Admitting: Family Medicine

## 2014-04-23 DIAGNOSIS — Z8489 Family history of other specified conditions: Secondary | ICD-10-CM

## 2014-04-25 ENCOUNTER — Ambulatory Visit: Payer: BC Managed Care – PPO | Admitting: Obstetrics and Gynecology

## 2014-05-16 ENCOUNTER — Ambulatory Visit: Payer: BC Managed Care – PPO | Admitting: Obstetrics and Gynecology

## 2014-05-23 ENCOUNTER — Encounter: Payer: Self-pay | Admitting: Obstetrics and Gynecology

## 2014-05-23 ENCOUNTER — Ambulatory Visit (INDEPENDENT_AMBULATORY_CARE_PROVIDER_SITE_OTHER): Payer: BC Managed Care – PPO | Admitting: Obstetrics and Gynecology

## 2014-05-23 VITALS — BP 100/64 | HR 76 | Resp 18 | Ht 63.5 in | Wt 140.4 lb

## 2014-05-23 DIAGNOSIS — Z01419 Encounter for gynecological examination (general) (routine) without abnormal findings: Secondary | ICD-10-CM

## 2014-05-23 DIAGNOSIS — Z113 Encounter for screening for infections with a predominantly sexual mode of transmission: Secondary | ICD-10-CM

## 2014-05-23 DIAGNOSIS — N920 Excessive and frequent menstruation with regular cycle: Secondary | ICD-10-CM

## 2014-05-23 LAB — CBC
HCT: 38.9 % (ref 36.0–46.0)
Hemoglobin: 12.8 g/dL (ref 12.0–15.0)
MCH: 26.7 pg (ref 26.0–34.0)
MCHC: 32.9 g/dL (ref 30.0–36.0)
MCV: 81.2 fL (ref 78.0–100.0)
PLATELETS: 331 10*3/uL (ref 150–400)
RBC: 4.79 MIL/uL (ref 3.87–5.11)
RDW: 13.4 % (ref 11.5–15.5)
WBC: 11 10*3/uL — AB (ref 4.0–10.5)

## 2014-05-23 MED ORDER — NORETHIN ACE-ETH ESTRAD-FE 1-20 MG-MCG PO TABS: 1.0000 | ORAL_TABLET | Freq: Every day | ORAL | Status: DC

## 2014-05-23 MED ORDER — DESOGESTREL-ETHINYL ESTRADIOL 0.15-0.02/0.01 MG (21/5) PO TABS: 1.0000 | ORAL_TABLET | Freq: Every day | ORAL | Status: DC

## 2014-05-23 NOTE — Progress Notes (Signed)
Patient ID: Sierra Mccullough, female   DOB: [birth year], 23 y.o.   MRN: [id number] GYNECOLOGY VISIT  PCP:  Beatrice Lecher, MD  Referring provider:   HPI: 23 y.o.   Single  Caucasian  female   G0P0 with Patient's last menstrual period was 05/16/2014.   here for  AEX.   Cycles are heavy and with bad cramping down back and legs for the first few days.  No intermenstrual bleeding.  Having greenish bloody discharge at the end of this cycle.  Resolved.  Some left sided pain when this occurred.  One new partner since last visit.  None currently.   Can have random left sided pain.   Tried LoLoEstrin for one month last year.  Stopped due to insurance issues.   Has migraines but not with aura.  They come before menses begin.  Headaches can last during the cycle sometimes.  Had migraines in general.  Used Topamax in the past.    Hgb:   PCP Urine:  Trace Ketones  GYNECOLOGIC HISTORY: Patient's last menstrual period was 05/16/2014. Sexually active:  Not currently Partner preference: female Contraception:   abstinence Menopausal hormone therapy: n/a DES exposure: no   Blood transfusions:  no  Sexually transmitted diseases:  no  GYN procedures and prior surgeries:  none Last mammogram:   n/a              Last pap and high risk HPV testing:   n/a History of abnormal pap smear:  n/a   OB History   Grav Para Term Preterm Abortions TAB SAB Ect Mult Living   0 0               LIFESTYLE: Exercise:  Cardio and weights           Tobacco:   no Alcohol:    no Drug use:  no  OTHER HEALTH MAINTENANCE: Tetanus/TDap:  2013 Gardisil:            no Influenza:          08/2013 Zostavax:          n/a  Bone density:    n/a Colonoscopy:   n/a  Cholesterol check:  never  Family History  Problem Relation Age of Onset  . Hypertension Mother   . Diabetes Mother   . Migraines Mother   . Diabetes Maternal Grandfather     Patient Active Problem List   Diagnosis Date Noted  . Left  shoulder pain 09/07/2013  . History of nephrolithiasis 11/10/2011  . Menorrhagia 11/10/2011   Past Medical History  Diagnosis Date  . Nephrolithiasis   . Anxiety   . Depression   . Dyspareunia   . Migraines     Past Surgical History  Procedure Laterality Date  . Urethral stent  2011    due to kidney stones    ALLERGIES: Keflex and Sulfonamide derivatives  No current outpatient prescriptions on file.   No current facility-administered medications for this visit.     ROS:  Pertinent items are noted in HPI.  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   PHYSICAL EXAMINATION:    BP 100/64  Pulse 76  Resp 18  Ht 5' 3.5" (1.613 m)  Wt 140 lb 6.4 oz (63.685 kg)  BMI 24.48 kg/m2  LMP 05/16/2014   Wt Readings from Last 3 Encounters:  05/23/14 140 lb 6.4 oz (63.685 kg)  09/07/13 163 lb (73.936 kg)  08/17/13 158 lb (71.668 kg) (86%*, Z = 1.07)   * Growth percentiles are based on CDC 2-20 Years data.     Ht Readings from Last 3 Encounters:  05/23/14 5' 3.5" (1.613 m)  04/19/13 5' 3.5" (1.613 m) (38%*, Z = -0.31)  04/16/13 5\' 4"  (1.626 m) (46%*, Z = -0.11)   * Growth percentiles are based on CDC 2-20 Years data.    General appearance: alert, cooperative and appears stated age Head: Normocephalic, without obvious abnormality, atraumatic Neck: no adenopathy, supple, symmetrical, trachea midline and thyroid not enlarged, symmetric, no tenderness/mass/nodules Lungs: clear to auscultation bilaterally Breasts: Inspection negative, No nipple retraction or dimpling, No nipple discharge or bleeding, No axillary or supraclavicular adenopathy, Normal to palpation without dominant masses Heart: regular rate and rhythm Abdomen: soft, non-tender; no masses,  no organomegaly Extremities: extremities normal, atraumatic, no cyanosis or edema Skin: Skin color, texture, turgor normal. No rashes or lesions Lymph nodes: Cervical,  supraclavicular, and axillary nodes normal. No abnormal inguinal nodes palpated Neurologic: Grossly normal  Pelvic: External genitalia:  no lesions              Urethra:  normal appearing urethra with no masses, tenderness or lesions              Bartholins and Skenes: normal                 Vagina: normal appearing vagina with normal color and discharge, no lesions              Cervix: normal appearance              Pap and high risk HPV testing done: No..            Bimanual Exam:  Uterus:  uterus is normal size, shape, consistency and nontender                                      Adnexa: normal adnexa in size, nontender and no masses                                      Rectovaginal: Confirms                                      Anus:  normal sphincter tone, no lesions  ASSESSMENT  Normal gynecologic exam. Menstrual migraines. No aura.  Menorrhagia.  Desire for STD testing.   PLAN  Mammogram recommended yearly at age 76 Pap smear and high risk HPV testing age 50.  Counseled on self breast exam.  Patient taught.  Discussed Gardasil.  Will consider.  Also discussed new HPV vaccine.  STD testing. CBC. Rx for Mircette 3 packs with 3 refills.  Return annually or prn   An After Visit Summary was printed and given to the patient.

## 2014-05-23 NOTE — Patient Instructions (Signed)

## 2014-05-24 ENCOUNTER — Encounter: Payer: Self-pay | Admitting: Obstetrics and Gynecology

## 2014-05-24 LAB — STD PANEL
HIV: NONREACTIVE
Hepatitis B Surface Ag: NEGATIVE

## 2014-05-24 LAB — GC/CHLAMYDIA PROBE AMP, URINE
CHLAMYDIA, SWAB/URINE, PCR: NEGATIVE
GC PROBE AMP, URINE: NEGATIVE

## 2014-05-24 LAB — HEPATITIS C ANTIBODY: HCV AB: NEGATIVE

## 2014-07-03 ENCOUNTER — Encounter: Payer: Self-pay | Admitting: Physician Assistant

## 2014-07-03 ENCOUNTER — Ambulatory Visit (INDEPENDENT_AMBULATORY_CARE_PROVIDER_SITE_OTHER): Payer: BC Managed Care – PPO | Admitting: Physician Assistant

## 2014-07-03 VITALS — BP 102/62 | HR 80 | Temp 97.6°F | Ht 64.0 in | Wt 143.0 lb

## 2014-07-03 DIAGNOSIS — Z0000 Encounter for general adult medical examination without abnormal findings: Secondary | ICD-10-CM

## 2014-07-03 DIAGNOSIS — Z131 Encounter for screening for diabetes mellitus: Secondary | ICD-10-CM

## 2014-07-03 DIAGNOSIS — Z13220 Encounter for screening for lipoid disorders: Secondary | ICD-10-CM

## 2014-07-03 NOTE — Patient Instructions (Signed)

## 2014-07-03 NOTE — Progress Notes (Signed)
   Subjective:    Patient ID: Sierra Mccullough, female    DOB: 1991/02/21, 21 y.o.   MRN: [id number]  HPI Pt is a 23 yo female who presents to the clinic for yearly physical. She has no complaints or concerns today. 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 She is in the process of getting some immunization as [facility] where she currently works. She is unaware of which ones. She needs an overall physical for her school as well.    Review of Systems  All other systems reviewed and are negative.      Objective:   Physical Exam  Constitutional: She is oriented to person, place, and time. She appears well-developed and well-nourished.  HENT:  Head: Normocephalic and atraumatic.  Right Ear: External ear normal.  Left Ear: External ear normal.  Nose: Nose normal.  Mouth/Throat: Oropharynx is clear and moist. No oropharyngeal exudate.  Eyes: Conjunctivae and EOM are normal. Pupils are equal, round, and reactive to light. Right eye exhibits no discharge. Left eye exhibits no discharge.  Neck: Normal range of motion. Neck supple. No thyromegaly present.  Cardiovascular: Normal rate, regular rhythm and normal heart sounds.   Pulmonary/Chest: Effort normal and breath sounds normal.  Abdominal: Soft. Bowel sounds are normal. She exhibits no distension. There is no tenderness.  Musculoskeletal: Normal range of motion.  Lymphadenopathy:    She has no cervical adenopathy.  Neurological: She is alert and oriented to person, place, and time. She has normal reflexes. No cranial nerve deficit.  Skin: Skin is dry.  Psychiatric: She has a normal mood and affect. Her behavior is normal.          Assessment & Plan:  CPE- pap smear not needed until 23. Tdap up to date. Unaware of status of other immunizations. We gave her a copy of what we had. She can return to clinic with she coordinates with [facility] what they have for immunizations. Forms filled out for physical exam entry  into college. Fasting labs given for pt to have drawn.

## 2014-08-07 ENCOUNTER — Ambulatory Visit (INDEPENDENT_AMBULATORY_CARE_PROVIDER_SITE_OTHER): Payer: BC Managed Care – PPO | Admitting: Family Medicine

## 2014-08-07 VITALS — Temp 98.2°F

## 2014-08-07 DIAGNOSIS — Z23 Encounter for immunization: Secondary | ICD-10-CM | POA: Diagnosis not present

## 2014-08-07 DIAGNOSIS — Z111 Encounter for screening for respiratory tuberculosis: Secondary | ICD-10-CM | POA: Diagnosis not present

## 2014-08-07 NOTE — Progress Notes (Signed)
   Subjective:    Patient ID: Sierra Mccullough, female    DOB: Apr 30, 1991, 23 y.o.   MRN: [id number]  HPI Pt presents today for PPD and flu shot. Denies fever. Margette Fast, CMA    Review of Systems     Objective:   Physical Exam        Assessment & Plan:

## 2014-08-09 LAB — TB SKIN TEST: TB Skin Test: NEGATIVE

## 2014-09-09 ENCOUNTER — Ambulatory Visit (INDEPENDENT_AMBULATORY_CARE_PROVIDER_SITE_OTHER): Payer: BC Managed Care – PPO

## 2014-09-09 ENCOUNTER — Encounter: Payer: Self-pay | Admitting: Sports Medicine

## 2014-09-09 ENCOUNTER — Ambulatory Visit (INDEPENDENT_AMBULATORY_CARE_PROVIDER_SITE_OTHER): Payer: BLUE CROSS/BLUE SHIELD | Admitting: Sports Medicine

## 2014-09-09 VITALS — BP 108/71 | HR 91 | Ht 64.0 in | Wt 149.0 lb

## 2014-09-09 DIAGNOSIS — R202 Paresthesia of skin: Secondary | ICD-10-CM

## 2014-09-09 DIAGNOSIS — M5412 Radiculopathy, cervical region: Secondary | ICD-10-CM | POA: Diagnosis not present

## 2014-09-09 DIAGNOSIS — M25511 Pain in right shoulder: Secondary | ICD-10-CM

## 2014-09-09 DIAGNOSIS — M542 Cervicalgia: Secondary | ICD-10-CM

## 2014-09-09 MED ORDER — CYCLOBENZAPRINE HCL 10 MG PO TABS: ORAL_TABLET | ORAL | Status: DC

## 2014-09-09 MED ORDER — PREDNISONE 50 MG PO TABS: ORAL_TABLET | ORAL | Status: DC

## 2014-09-09 NOTE — Assessment & Plan Note (Signed)
Right-sided C6 versus C7. X-rays, PT, prednisone, Flexeril. Return in 4 weeks, MRI for interventional injection planning if no better.

## 2014-09-09 NOTE — Progress Notes (Signed)
   Subjective:    I'm seeing this patient as a consultation for:  Dr. Madilyn Fireman  CC: Right shoulder pain  HPI: This is a very pleasant 23 year old female, for the past several months she has had pain in her right posterior shoulder, periscapular, worse when moving her neck with left lateral bending, there is radiation into the lateral forearm. Symptoms are moderate, persistent, no trauma, no lower extremity symptoms, no bowel or bladder dysfunction. No constitutional symptoms.  Past medical history, Surgical history, Family history not pertinant except as noted below, Social history, Allergies, and medications have been entered into the medical record, reviewed, and no changes needed.   Review of Systems: No headache, visual changes, nausea, vomiting, diarrhea, constipation, dizziness, abdominal pain, skin rash, fevers, chills, night sweats, weight loss, swollen lymph nodes, body aches, joint swelling, muscle aches, chest pain, shortness of breath, mood changes, visual or auditory hallucinations.   Objective:   General: Well Developed, well nourished, and in no acute distress.  Neuro/Psych: Alert and oriented x3, extra-ocular muscles intact, able to move all 4 extremities, sensation grossly intact. Skin: Warm and dry, no rashes noted.  Respiratory: Not using accessory muscles, speaking in full sentences, trachea midline.  Cardiovascular: Pulses palpable, no extremity edema. Abdomen: Does not appear distended. Neck: Negative spurling's Full neck range of motion Grip strength and sensation normal in bilateral hands Strength good C4 to T1 distribution No sensory change to C4 to T1 Reflexes normal  Xrays reviewed and are unremarkable.  Impression and Recommendations:   This case required medical decision making of moderate complexity.

## 2014-09-18 ENCOUNTER — Ambulatory Visit (INDEPENDENT_AMBULATORY_CARE_PROVIDER_SITE_OTHER): Payer: BC Managed Care – PPO | Admitting: Obstetrics and Gynecology

## 2014-09-18 ENCOUNTER — Encounter: Payer: Self-pay | Admitting: Obstetrics and Gynecology

## 2014-09-18 ENCOUNTER — Telehealth: Payer: Self-pay | Admitting: Obstetrics and Gynecology

## 2014-09-18 VITALS — BP 108/70 | HR 64 | Resp 18 | Ht 64.0 in | Wt 151.0 lb

## 2014-09-18 DIAGNOSIS — R1032 Left lower quadrant pain: Secondary | ICD-10-CM

## 2014-09-18 DIAGNOSIS — N921 Excessive and frequent menstruation with irregular cycle: Secondary | ICD-10-CM

## 2014-09-18 LAB — POCT URINE PREGNANCY: Preg Test, Ur: NEGATIVE

## 2014-09-18 LAB — CBC
HEMATOCRIT: 39.7 % (ref 36.0–46.0)
HEMOGLOBIN: 13.1 g/dL (ref 12.0–15.0)
MCH: 26.8 pg (ref 26.0–34.0)
MCHC: 33 g/dL (ref 30.0–36.0)
MCV: 81.4 fL (ref 78.0–100.0)
Platelets: 338 10*3/uL (ref 150–400)
RBC: 4.88 MIL/uL (ref 3.87–5.11)
RDW: 14.1 % (ref 11.5–15.5)
WBC: 10.1 10*3/uL (ref 4.0–10.5)

## 2014-09-18 MED ORDER — NORETHIN-ETH ESTRAD-FE BIPHAS 1 MG-10 MCG / 10 MCG PO TABS: 1.0000 | ORAL_TABLET | Freq: Every day | ORAL | Status: DC

## 2014-09-18 NOTE — Telephone Encounter (Signed)
Spoke with patient. Advised spoke with Dr.Silva and we can see her today at 1pm. Patient is agreeable to date and time. Appointment scheduled.  Routing to provider for final review. Patient agreeable to disposition. Will close encounter

## 2014-09-18 NOTE — Telephone Encounter (Signed)
Spoke with patient. Patient states that she started new OCP 3 months ago. Over the last two months she has been bleeding continuously. Patient was taking OCP at the same time daily.  "It was like a constant period for two months. I thought I could wait it out while my body adjusted but it hasn't stopped." Patient stopped taking OCP 3 days ago due to continual cycle. "Since I stopped my bleeding has increased and I have left sided back pain." Denies any urinary symptoms, weakness, being light headed, or shortness of breath. "I have felt more tired lately though." States that she is having to change her pad/tampon every hour. "It is almost soaking through every hour." Advised patient will need to be seen for evaluation with Dr.Silva. Advised will speak with Dr.Silva and return call with best appointment options. Patient is agreeable.

## 2014-09-18 NOTE — Telephone Encounter (Signed)
Pts mom is calling stated the pt has been bleeding for about a month. [phone]

## 2014-09-18 NOTE — Addendum Note (Signed)
Addended by: Elroy Channel on: 09/18/2014 02:15 PM   Modules accepted: Orders

## 2014-09-18 NOTE — Telephone Encounter (Signed)
Left message to call Kaitlyn at [phone]. 

## 2014-09-18 NOTE — Progress Notes (Signed)
GYNECOLOGY  VISIT   HPI: 23 y.o.   Single  Caucasian  female   G0P0 with Patient's last menstrual period was 08/26/2014.   here for Bleeding with OCP Started bleeding in September. Having bad cramping and clotting. Has lower left back pain.  Stopped taking OCPs two days ago and it is now heavier and changing pad up to every 1 - 2 hours.  Pamprin helping cramping and bleeding.  Started Mircette about 3 months ago. Missed one pill only.  Still with headaches.  Last sexually activity was 2 months ago.   Did well on LoLoEstrin in the past.  Switched due to insurance reasons.  This is now resolved and can return to this OCP.  GYNECOLOGIC HISTORY: Patient's last menstrual period was 08/26/2014. Contraception:  none  Menopausal hormone therapy: none        OB History   Grav Para Term Preterm Abortions TAB SAB Ect Mult Living   0 0                 Patient Active Problem List   Diagnosis Date Noted  . Right cervical radiculopathy 09/09/2014  . History of nephrolithiasis 11/10/2011  . Menorrhagia 11/10/2011    Past Medical History  Diagnosis Date  . Nephrolithiasis   . Anxiety   . Depression   . Dyspareunia   . Migraines     Past Surgical History  Procedure Laterality Date  . Urethral stent  2011    due to kidney stones    Current Outpatient Prescriptions  Medication Sig Dispense Refill  . cyclobenzaprine (FLEXERIL) 10 MG tablet One half tab PO qHS, then increase gradually to one tab TID.  30 tablet  0  . desogestrel-ethinyl estradiol (KARIVA,AZURETTE,MIRCETTE) 0.15-0.02/0.01 MG (21/5) tablet Take 1 tablet by mouth daily.  3 Package  3  . predniSONE (DELTASONE) 50 MG tablet One tab PO daily for 5 days.  5 tablet  0   No current facility-administered medications for this visit.     ALLERGIES: Keflex and Sulfonamide derivatives  Family History  Problem Relation Age of Onset  . Hypertension Mother   . Diabetes Mother   . Migraines Mother   . Diabetes Maternal  Grandfather     History   Social History  . Marital Status: Single    Spouse Name: N/A    Number of Children: N/A  . Years of Education: N/A   Occupational History  . Not on file.   Social History Main Topics  . Smoking status: Never Smoker   . Smokeless tobacco: Never Used  . Alcohol Use: No  . Drug Use: No  . Sexual Activity: No   Other Topics Concern  . Not on file   Social History Narrative   Born in [place].  Mother is Writer adn father is in Architect.  Lives with mother [name] and sister [name].     ROS:  Pertinent items are noted in HPI.  PHYSICAL EXAMINATION:    Ht 5\' 4"  (1.626 m)  Wt 151 lb (68.493 kg)  BMI 25.91 kg/m2  LMP 08/26/2014     General appearance: alert, cooperative and appears stated age Abdomen: soft, non-tender; no masses,  no organomegaly No abnormal inguinal nodes palpated  Pelvic: External genitalia:  no lesions              Urethra:  normal appearing urethra with no masses, tenderness or lesions  Bartholins and Skenes: normal                 Vagina: normal appearing vagina with normal color and discharge, no lesions              Cervix: normal appearance                   Bimanual Exam:  Uterus:  uterus is normal size, shape, consistency and nontender                                      Adnexa: normal adnexa in size, nontender and no masses                                   ASSESSMENT  LLQ pain with left back pain.  Menorrhagia with irregular menses.  History of kidney stones.   PLAN  UPT.  CBC.  Start LoLoEstrin.  See orders.  Return for pelvic ultrasound.  Patient may decline.  If pain persists despite switching to a new OCP, will need to complete US.   An After Visit Summary was printed and given to the patient.  __25____ minutes face to face time of which over 50% was spent in counseling.

## 2014-09-20 ENCOUNTER — Ambulatory Visit (INDEPENDENT_AMBULATORY_CARE_PROVIDER_SITE_OTHER): Payer: BC Managed Care – PPO | Admitting: Physical Therapy

## 2014-09-20 DIAGNOSIS — M546 Pain in thoracic spine: Secondary | ICD-10-CM

## 2014-09-20 DIAGNOSIS — M542 Cervicalgia: Secondary | ICD-10-CM

## 2014-09-20 DIAGNOSIS — M25519 Pain in unspecified shoulder: Secondary | ICD-10-CM

## 2014-09-20 DIAGNOSIS — R5381 Other malaise: Secondary | ICD-10-CM

## 2014-09-20 DIAGNOSIS — M5412 Radiculopathy, cervical region: Secondary | ICD-10-CM

## 2014-09-23 ENCOUNTER — Encounter (INDEPENDENT_AMBULATORY_CARE_PROVIDER_SITE_OTHER): Payer: BC Managed Care – PPO | Admitting: Physical Therapy

## 2014-09-23 DIAGNOSIS — M546 Pain in thoracic spine: Secondary | ICD-10-CM

## 2014-09-23 DIAGNOSIS — R5381 Other malaise: Secondary | ICD-10-CM

## 2014-09-23 DIAGNOSIS — M25519 Pain in unspecified shoulder: Secondary | ICD-10-CM

## 2014-09-23 DIAGNOSIS — M5412 Radiculopathy, cervical region: Secondary | ICD-10-CM

## 2014-09-23 DIAGNOSIS — M542 Cervicalgia: Secondary | ICD-10-CM

## 2014-09-24 ENCOUNTER — Telehealth: Payer: Self-pay | Admitting: Obstetrics and Gynecology

## 2014-09-24 NOTE — Telephone Encounter (Signed)
Left message for patient to call back. Need to go over benefits and schedule PUS.  Pr $415.12

## 2014-09-30 ENCOUNTER — Encounter (INDEPENDENT_AMBULATORY_CARE_PROVIDER_SITE_OTHER): Payer: BC Managed Care – PPO | Admitting: Physical Therapy

## 2014-09-30 DIAGNOSIS — M546 Pain in thoracic spine: Secondary | ICD-10-CM

## 2014-09-30 DIAGNOSIS — M25519 Pain in unspecified shoulder: Secondary | ICD-10-CM

## 2014-09-30 DIAGNOSIS — M5412 Radiculopathy, cervical region: Secondary | ICD-10-CM

## 2014-09-30 DIAGNOSIS — M5381 Other specified dorsopathies, occipito-atlanto-axial region: Secondary | ICD-10-CM

## 2014-09-30 DIAGNOSIS — M542 Cervicalgia: Secondary | ICD-10-CM

## 2014-10-07 ENCOUNTER — Encounter (INDEPENDENT_AMBULATORY_CARE_PROVIDER_SITE_OTHER): Payer: BC Managed Care – PPO | Admitting: Physical Therapy

## 2014-10-07 ENCOUNTER — Ambulatory Visit (INDEPENDENT_AMBULATORY_CARE_PROVIDER_SITE_OTHER): Payer: BC Managed Care – PPO | Admitting: Sports Medicine

## 2014-10-07 ENCOUNTER — Encounter: Payer: Self-pay | Admitting: Sports Medicine

## 2014-10-07 VITALS — BP 121/75 | HR 83 | Ht 64.0 in | Wt 150.0 lb

## 2014-10-07 DIAGNOSIS — M546 Pain in thoracic spine: Secondary | ICD-10-CM

## 2014-10-07 DIAGNOSIS — M5412 Radiculopathy, cervical region: Secondary | ICD-10-CM | POA: Diagnosis not present

## 2014-10-07 DIAGNOSIS — R5381 Other malaise: Secondary | ICD-10-CM

## 2014-10-07 DIAGNOSIS — M25519 Pain in unspecified shoulder: Secondary | ICD-10-CM | POA: Diagnosis not present

## 2014-10-07 DIAGNOSIS — M542 Cervicalgia: Secondary | ICD-10-CM

## 2014-10-07 MED ORDER — GABAPENTIN 300 MG PO CAPS: ORAL_CAPSULE | ORAL | Status: DC

## 2014-10-07 NOTE — Telephone Encounter (Signed)
Thank you. Encounter closed. 

## 2014-10-07 NOTE — Telephone Encounter (Signed)
Spoke with patient. Advised that per benefit quote received, she will be responsible for $415.12 when she comes in for PUS. Patient agreeable, but declines scheduling at this time. States "I think I'm going to hold off on it for a little bit".

## 2014-10-07 NOTE — Progress Notes (Signed)
  Subjective:    CC: follow-up  HPI: Right cervical radiculitis: Right sided periscapular, with occasional radiation down the arm, moderate, persistent. No response to prednisone, physical therapy, she did have a slight response to Flexeril suggesting a myofascial component.  Past medical history, Surgical history, Family history not pertinant except as noted below, Social history, Allergies, and medications have been entered into the medical record, reviewed, and no changes needed.   Review of Systems: No fevers, chills, night sweats, weight loss, chest pain, or shortness of breath.   Objective:    General: Well Developed, well nourished, and in no acute distress.  Neuro: Alert and oriented x3, extra-ocular muscles intact, sensation grossly intact.  HEENT: Normocephalic, atraumatic, pupils equal round reactive to light, neck supple, no masses, no lymphadenopathy, thyroid nonpalpable.  Skin: Warm and dry, no rashes. Cardiac: Regular rate and rhythm, no murmurs rubs or gallops, no lower extremity edema.  Respiratory: Clear to auscultation bilaterally. Not using accessory muscles, speaking in full sentences. Neck: Negative spurling's Full neck range of motion Grip strength and sensation normal in bilateral hands Strength good C4 to T1 distribution No sensory change to C4 to T1 Reflexes normal  Cervical spine x-rays did show straightening of the normal lordosis suggestive of spasm.  Impression and Recommendations:

## 2014-10-07 NOTE — Assessment & Plan Note (Signed)
No response to physical therapy for 6 weeks, NSAIDs, muscle relaxers. Prednisone was ineffective. Cervical spine MRI, also including thoracic spine MRI and she does have some right-sided thoracic radicular symptoms.

## 2014-10-08 ENCOUNTER — Telehealth: Payer: Self-pay | Admitting: *Deleted

## 2014-10-08 NOTE — Telephone Encounter (Signed)
MRI thoracic approval [id number]. Both expire 11/06/14. Margette Fast, CMA

## 2014-12-16 ENCOUNTER — Emergency Department (HOSPITAL_BASED_OUTPATIENT_CLINIC_OR_DEPARTMENT_OTHER)
Admission: EM | Admit: 2014-12-16 | Discharge: 2014-12-17 | Disposition: A | Discharge status: Home / Self Care | Payer: BLUE CROSS/BLUE SHIELD | Attending: Emergency Medicine | Admitting: Emergency Medicine

## 2014-12-16 ENCOUNTER — Emergency Department (HOSPITAL_BASED_OUTPATIENT_CLINIC_OR_DEPARTMENT_OTHER): Payer: BLUE CROSS/BLUE SHIELD

## 2014-12-16 ENCOUNTER — Emergency Department: Admission: EM | Admit: 2014-12-16 | Discharge: 2014-12-16 | Payer: BLUE CROSS/BLUE SHIELD | Source: Home / Self Care

## 2014-12-16 ENCOUNTER — Encounter (HOSPITAL_BASED_OUTPATIENT_CLINIC_OR_DEPARTMENT_OTHER): Payer: Self-pay | Admitting: *Deleted

## 2014-12-16 DIAGNOSIS — F329 Major depressive disorder, single episode, unspecified: Secondary | ICD-10-CM | POA: Insufficient documentation

## 2014-12-16 DIAGNOSIS — R1011 Right upper quadrant pain: Secondary | ICD-10-CM

## 2014-12-16 DIAGNOSIS — R197 Diarrhea, unspecified: Secondary | ICD-10-CM | POA: Diagnosis not present

## 2014-12-16 DIAGNOSIS — F419 Anxiety disorder, unspecified: Secondary | ICD-10-CM | POA: Diagnosis not present

## 2014-12-16 DIAGNOSIS — Z87442 Personal history of urinary calculi: Secondary | ICD-10-CM | POA: Diagnosis not present

## 2014-12-16 DIAGNOSIS — Z3202 Encounter for pregnancy test, result negative: Secondary | ICD-10-CM | POA: Diagnosis not present

## 2014-12-16 DIAGNOSIS — Z793 Long term (current) use of hormonal contraceptives: Secondary | ICD-10-CM | POA: Insufficient documentation

## 2014-12-16 DIAGNOSIS — G43909 Migraine, unspecified, not intractable, without status migrainosus: Secondary | ICD-10-CM | POA: Diagnosis not present

## 2014-12-16 DIAGNOSIS — Z79899 Other long term (current) drug therapy: Secondary | ICD-10-CM | POA: Diagnosis not present

## 2014-12-16 LAB — URINALYSIS, ROUTINE W REFLEX MICROSCOPIC
BILIRUBIN URINE: NEGATIVE
Glucose, UA: NEGATIVE mg/dL
HGB URINE DIPSTICK: NEGATIVE
KETONES UR: NEGATIVE mg/dL
LEUKOCYTES UA: NEGATIVE
Nitrite: NEGATIVE
PH: 6.5 (ref 5.0–8.0)
PROTEIN: NEGATIVE mg/dL
Specific Gravity, Urine: 1.019 (ref 1.005–1.030)
Urobilinogen, UA: 0.2 mg/dL (ref 0.0–1.0)

## 2014-12-16 LAB — CBC WITH DIFFERENTIAL/PLATELET
BASOS ABS: 0 10*3/uL (ref 0.0–0.1)
BASOS PCT: 0 % (ref 0–1)
EOS ABS: 0 10*3/uL (ref 0.0–0.7)
EOS PCT: 0 % (ref 0–5)
HCT: 38.6 % (ref 36.0–46.0)
Hemoglobin: 12.6 g/dL (ref 12.0–15.0)
LYMPHS PCT: 16 % (ref 12–46)
Lymphs Abs: 2.4 10*3/uL (ref 0.7–4.0)
MCH: 26.8 pg (ref 26.0–34.0)
MCHC: 32.6 g/dL (ref 30.0–36.0)
MCV: 82.1 fL (ref 78.0–100.0)
Monocytes Absolute: 1.1 10*3/uL — ABNORMAL HIGH (ref 0.1–1.0)
Monocytes Relative: 7 % (ref 3–12)
NEUTROS ABS: 11.8 10*3/uL — AB (ref 1.7–7.7)
Neutrophils Relative %: 77 % (ref 43–77)
Platelets: 296 10*3/uL (ref 150–400)
RBC: 4.7 MIL/uL (ref 3.87–5.11)
RDW: 12.9 % (ref 11.5–15.5)
WBC: 15.4 10*3/uL — ABNORMAL HIGH (ref 4.0–10.5)

## 2014-12-16 LAB — COMPREHENSIVE METABOLIC PANEL
ALBUMIN: 4.7 g/dL (ref 3.5–5.2)
ALT: 33 U/L (ref 0–35)
ANION GAP: 6 (ref 5–15)
AST: 23 U/L (ref 0–37)
Alkaline Phosphatase: 66 U/L (ref 39–117)
BUN: 15 mg/dL (ref 6–23)
CALCIUM: 9.3 mg/dL (ref 8.4–10.5)
CHLORIDE: 106 meq/L (ref 96–112)
CO2: 27 mmol/L (ref 19–32)
CREATININE: 0.69 mg/dL (ref 0.50–1.10)
GFR calc Af Amer: >90 mL/min (ref >90)
GLUCOSE: 85 mg/dL (ref 70–99)
Potassium: 3.3 mmol/L — ABNORMAL LOW (ref 3.5–5.1)
Sodium: 139 mmol/L (ref 135–145)
Total Bilirubin: 0.4 mg/dL (ref 0.3–1.2)
Total Protein: 7.9 g/dL (ref 6.0–8.3)

## 2014-12-16 LAB — LIPASE, BLOOD: LIPASE: 36 U/L (ref 11–59)

## 2014-12-16 LAB — PREGNANCY, URINE: Preg Test, Ur: NEGATIVE

## 2014-12-16 MED ORDER — IOHEXOL 300 MG/ML  SOLN
25.0000 mL | Freq: Once | INTRAMUSCULAR | Status: AC | PRN
  Administered 2014-12-16: 25 mL via ORAL

## 2014-12-16 MED ORDER — OXYCODONE-ACETAMINOPHEN 5-325 MG PO TABS: 1.0000 | ORAL_TABLET | ORAL | Status: DC | PRN

## 2014-12-16 MED ORDER — ONDANSETRON HCL 4 MG/2ML IJ SOLN
INTRAMUSCULAR | Status: AC
  Administered 2014-12-16: 4 mg
  Filled 2014-12-16: qty 2

## 2014-12-16 MED ORDER — IOHEXOL 300 MG/ML  SOLN
100.0000 mL | Freq: Once | INTRAMUSCULAR | Status: AC | PRN
  Administered 2014-12-16: 100 mL via INTRAVENOUS

## 2014-12-16 MED ORDER — ONDANSETRON 4 MG PO TBDP: ORAL_TABLET | ORAL | Status: DC

## 2014-12-16 MED ORDER — HYDROMORPHONE HCL 1 MG/ML IJ SOLN
1.0000 mg | Freq: Once | INTRAMUSCULAR | Status: AC
  Administered 2014-12-16: 1 mg via INTRAVENOUS
  Filled 2014-12-16: qty 1

## 2014-12-16 MED ORDER — HYDROMORPHONE HCL 1 MG/ML IJ SOLN
INTRAMUSCULAR | Status: AC
  Administered 2014-12-16: 1 mg
  Filled 2014-12-16: qty 1

## 2014-12-16 NOTE — Discharge Instructions (Signed)

## 2014-12-16 NOTE — ED Notes (Signed)
Right upper quadrant pain today. Nausea.

## 2014-12-16 NOTE — ED Provider Notes (Signed)
CSN: [id number]     Arrival date & time 12/16/14  1853 History  This chart was scribed for Debby Freiberg, MD by Chester Holstein, ED Scribe. This patient was seen in room MH01/MH01 and the patient's care was started at 8:01 PM.      Chief Complaint  Patient presents with  . Abdominal Pain    Patient is a 24 y.o. female presenting with abdominal pain. No language interpreter was used.  Abdominal Pain Associated symptoms: diarrhea   Associated symptoms: no fever, no nausea and no vomiting     HPI Comments: Sierra Mccullough is a 24 y.o. female with PMHx of nephrolithiasis, anxiety, and migraines who presents to the Emergency Department complaining of sharp RUQ abdominal pain with onset at 12:30 PM today.  Pt notes she was at the gym at onset, but had not eaten prior to onset. Pt had similar sharp abdominal pain last week immediately following eating on several occasions with associated diarrhea.  Pt denies pain feeling like prior kidney stones. Pt denies fever, vomiting, cough, congestion, sore throat.  Symptoms constant, nonradiating.  Past Medical History  Diagnosis Date  . Nephrolithiasis   . Anxiety   . Depression   . Dyspareunia   . Migraines    Past Surgical History  Procedure Laterality Date  . Urethral stent  2011    due to kidney stones   Family History  Problem Relation Age of Onset  . Hypertension Mother   . Diabetes Mother   . Migraines Mother   . Diabetes Maternal Grandfather    History  Substance Use Topics  . Smoking status: Never Smoker   . Smokeless tobacco: Never Used  . Alcohol Use: No   OB History    Gravida Para Term Preterm AB TAB SAB Ectopic Multiple Living   0 0             Review of Systems  Constitutional: Negative for fever.  Gastrointestinal: Positive for abdominal pain and diarrhea. Negative for nausea and vomiting.  All other systems reviewed and are negative.     Allergies  Keflex and Sulfonamide derivatives  Home Medications    Prior to Admission medications   Medication Sig Start Date End Date Taking? Authorizing Provider  cyclobenzaprine (FLEXERIL) 10 MG tablet One half tab PO qHS, then increase gradually to one tab TID. 09/09/14   Silverio Decamp, MD  gabapentin (NEURONTIN) 300 MG capsule One tab PO qHS for a week, then BID for a week, then TID. May double weekly to a max of 3,600mg /day 10/07/14   Silverio Decamp, MD  Norethindrone-Ethinyl Estradiol-Fe Biphas (LO LOESTRIN FE) 1 MG-10 MCG / 10 MCG tablet Take 1 tablet by mouth daily. 09/18/14   Flagler, MD  ondansetron (ZOFRAN ODT) 4 MG disintegrating tablet 4mg  ODT q4 hours prn nausea/vomit 12/16/14   Debby Freiberg, MD  oxyCODONE-acetaminophen (PERCOCET/ROXICET) 5-325 MG per tablet Take 1-2 tablets by mouth every 4 (four) hours as needed for severe pain. 12/16/14   Debby Freiberg, MD   BP 132/81 mmHg  Pulse 81  Temp(Src) 97.8 F (36.6 C) (Oral)  Resp 16  Ht 5\' 4"  (1.626 m)  Wt 142 lb (64.411 kg)  BMI 24.36 kg/m2  SpO2 100%  LMP 12/15/2014 Physical Exam  Constitutional: She is oriented to person, place, and time. She appears well-developed and well-nourished.  HENT:  Head: Normocephalic and atraumatic.  Right Ear: External ear normal.  Left Ear: External ear normal.  Eyes: Conjunctivae and EOM are normal. Pupils are equal, round, and reactive to light.  Neck: Normal range of motion. Neck supple.  Cardiovascular: Normal rate, regular rhythm, normal heart sounds and intact distal pulses.   Pulmonary/Chest: Effort normal and breath sounds normal.  Abdominal: Soft. Bowel sounds are normal. There is tenderness in the right upper quadrant. There is no CVA tenderness.  Musculoskeletal: Normal range of motion.  Neurological: She is alert and oriented to person, place, and time.  Skin: Skin is warm and dry.  Vitals reviewed.   ED Course  Procedures (including critical care time) DIAGNOSTIC STUDIES: Oxygen Saturation  is 100% on room air, normal by my interpretation.    COORDINATION OF CARE: 8:04 PM Discussed treatment plan with patient at beside, the patient agrees with the plan and has no further questions at this time.   Labs Review Labs Reviewed  COMPREHENSIVE METABOLIC PANEL - Abnormal; Notable for the following:    Potassium 3.3 (*)    All other components within normal limits  CBC WITH DIFFERENTIAL - Abnormal; Notable for the following:    WBC 15.4 (*)    Neutro Abs 11.8 (*)    Monocytes Absolute 1.1 (*)    All other components within normal limits  URINALYSIS, ROUTINE W REFLEX MICROSCOPIC  PREGNANCY, URINE  LIPASE, BLOOD    Imaging Review US Abdomen Complete  12/16/2014   CLINICAL DATA:  Right upper quadrant pain since 12 p.m.  EXAM: ULTRASOUND ABDOMEN COMPLETE  COMPARISON:  None.  FINDINGS: Gallbladder: No gallstones or wall thickening visualized. No sonographic Murphy sign noted.  Common bile duct: Diameter: 2 mm  Liver: No focal lesion identified. Within normal limits in parenchymal echogenicity.  IVC: No abnormality visualized.  Pancreas: Visualized portion unremarkable.  Spleen: Size and appearance within normal limits.  Right Kidney: Length: 9.8 cm. Echogenicity within normal limits. No hydronephrosis visualized. There is a 1.6 x 1.3 cm masslike area along the inferior pole of the right kidney which may reflect normal lobulation as the area is isoechoic to the remainder of the renal parenchyma versus a solid mass.  Left Kidney: Length: 9 cm. Echogenicity within normal limits. No mass or hydronephrosis visualized.  Abdominal aorta: No aneurysm visualized.  Other findings: None.  IMPRESSION: 1. No cholelithiasis or sonographic evidence of acute cholecystitis. 2. There is a 1.6 x 1.3 cm masslike area along the inferior pole of the right kidney which may reflect normal lobulation as the area is isoechoic to the remainder of the renal parenchyma versus a solid mass. Recommend further evaluation with  a nonemergent renal mass protocol CT of the abdomen.   Electronically Signed   By: Kathreen Devoid   On: 12/16/2014 21:18   Ct Abdomen Pelvis W Contrast  12/16/2014   CLINICAL DATA:  Right upper quadrant pain for 1 day with nausea. History kidney stones. Small apparent mass seen in the inferior right kidney on the current abdomen ultrasound.  EXAM: CT ABDOMEN AND PELVIS WITH CONTRAST  TECHNIQUE: Multidetector CT imaging of the abdomen and pelvis was performed using the standard protocol following bolus administration of intravenous contrast.  CONTRAST:  137mL OMNIPAQUE IOHEXOL 300 MG/ML SOLN, 82mL OMNIPAQUE IOHEXOL 300 MG/ML SOLN  COMPARISON:  Current abdomen ultrasound.  FINDINGS: Clear lung bases.  Heart normal size.  There are 2 similar size enhancing liver lesions, 1 in the anterior aspect of the medial segment of the left lobe and the other in the posterior medial aspect of the posterior segment of the  right lobe at the liver dome. Both measures 3.7 cm in diameter. The more anterior lesion has an area central hypoattenuation. The native lesion show diffuse enhancement. Given this patient's age, these are likely either focal nodular hyperplasia or hepatic adenoma. No other liver abnormality. Neither of these lesions were seen on the ultrasound.  Spleen, gallbladder, pancreas, adrenal glands:  Normal.  Small bilateral nonobstructing intrarenal stones. No ureteral stones or hydronephrosis.  No renal masses. There is no abnormality to correspond to the questionable mass noted along the lower right kidney on ultrasound. Bladder is unremarkable.  2.9 cm x 1.5 cm fluid attenuation area along the right posterior margin of the uterus, likely the right ovary. Trace amount pelvic free fluid, likely physiologic. Uterus normal in size no adnexal masses.  No pathologically enlarged lymph nodes.  Normal colon and small bowel.  Normal appendix visualized.  No bony abnormality.  IMPRESSION: 1. Two enhancing liver lesions both  measuring 3.7 cm. These were not evident sonographically. Air most likely either hepatic adenoma or areas of focal nodular hyperplasia. These could be further characterized with liver MRI with and without contrast if desired clinically. 2. No renal mass. Abnormality noted on the current ultrasound likely was an artifact. 3. No acute findings.  No ureteral stone or hydronephrosis. 4. Small bilateral nonobstructing intrarenal stones. 5. Normal appendix visualized.   Electronically Signed   By: Lajean Manes M.D.   On: 12/16/2014 22:25     EKG Interpretation None      MDM   Final diagnoses:  RUQ pain  Abdominal pain, RUQ    24 y.o. female with pertinent PMH of anxiety, nephrolithiasis presents with recurrent abdominal pain as described above. Symptoms historically concerning for biliary colic given worsening with eating and fairly sudden onset. No fevers, however patient has had some diarrhea.  Exam as above with right upper quadrant tenderness. No urinary symptoms reported.  Workup as above remarkable for 2 hepatic masses, likely adenoma or focal nodular hyperplasia. Pain relieved with narcotics. Patient tolerating by mouth without difficulty. Standard return precautions given, discharged home in stable condition.  I have reviewed all laboratory and imaging studies if ordered as above  1. RUQ pain   2. Abdominal pain, RUQ           Debby Freiberg, MD 12/16/14 [phone]

## 2014-12-17 ENCOUNTER — Other Ambulatory Visit: Payer: Self-pay | Admitting: *Deleted

## 2014-12-17 ENCOUNTER — Encounter: Payer: Self-pay | Admitting: Family Medicine

## 2014-12-17 ENCOUNTER — Ambulatory Visit (INDEPENDENT_AMBULATORY_CARE_PROVIDER_SITE_OTHER): Payer: BLUE CROSS/BLUE SHIELD | Admitting: Family Medicine

## 2014-12-17 VITALS — BP 122/80 | HR 85 | Wt 152.0 lb

## 2014-12-17 DIAGNOSIS — K7689 Other specified diseases of liver: Secondary | ICD-10-CM

## 2014-12-17 DIAGNOSIS — K769 Liver disease, unspecified: Secondary | ICD-10-CM

## 2014-12-17 NOTE — Progress Notes (Signed)
CC: Sierra Mccullough is a 24 y.o. female is here for f/u ER   Subjective: HPI:  Emergency room follow-up  Yesterday while exercising on an elliptical she had sudden right upper quadrant pain localized underneath her anterior rib cage that was nonradiating. Within a matter of an hour it became severe in severity so she presented to a local emergency room. Pain was improved only with IV narcotics.  Pain was worse with breathing or any rotational maneuvers of the torso. Interventions included an ultrasound showing unremarkable gallbladder anatomy and a unremarkable liver anatomy. A CT scan with contrast later revealed to 3.7 cm liver masses felt to be either a hepatic adenoma or focal nodular hyperplasia. She tells me her pain is significantly improved today and only mild in severity but character is the same. She reports having loose bowel movements one week ago for one day and some indigestion since then but no other abdominal complaints other than that described above. Lab work was done in the emergency room and significant only for a leukocytosis of 15. She denies any fevers, chills, unintentional weight loss or gain. She denies any cough, shortness of breath, dysuria, urinary frequency or urgency. She denies any diarrhea or constipation and evaluation yesterday. She denies nausea.   Review Of Systems Outlined In HPI  Past Medical History  Diagnosis Date  . Nephrolithiasis   . Anxiety   . Depression   . Dyspareunia   . Migraines     Past Surgical History  Procedure Laterality Date  . Urethral stent  2011    due to kidney stones   Family History  Problem Relation Age of Onset  . Hypertension Mother   . Diabetes Mother   . Migraines Mother   . Diabetes Maternal Grandfather     History   Social History  . Marital Status: Single    Spouse Name: N/A    Number of Children: N/A  . Years of Education: N/A   Occupational History  . Not on file.   Social History Main Topics  .  Smoking status: Never Smoker   . Smokeless tobacco: Never Used  . Alcohol Use: No  . Drug Use: No  . Sexual Activity: No   Other Topics Concern  . Not on file   Social History Narrative   Born in [place].  Mother is Writer adn father is in Architect.  Lives with mother Sierra Mccullough and sister Sierra Mccullough.      Objective: BP 122/80 mmHg  Pulse 85  Wt 152 lb (68.947 kg)  LMP 12/15/2014  General: Alert and Oriented, No Acute Distress HEENT: Pupils equal, round, reactive to light. Conjunctivae clear.  Moist mucous membranes pharynx unremarkable Lungs: Clear to auscultation bilaterally, no wheezing/ronchi/rales.  Comfortable work of breathing. Good air movement. Cardiac: Regular rate and rhythm. Normal S1/S2.  No murmurs, rubs, nor gallops.   Abdomen: Normal bowel sounds, soft without guarding or rigidity. No palpable masses. Her pain is reproduced with palpation underneath the right ninth and eighth rib. No pain with palpation of the individual ribs in the lower right thorax. Extremities: No peripheral edema.  Strong peripheral pulses.  Mental Status: No depression, anxiety, nor agitation. Skin: Warm and dry.  Assessment & Plan: Keiasia was seen today for f/u er.  Diagnoses and associated orders for this visit:  Liver lesion - Ambulatory referral to Gastroenterology    Discussed with Mccullough and her mother that I'm not convinced her liver lesions are the source of  her pain and I suspect that her pain is more likely due 2 acute muscular strain. 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 The Mccullough her mother and myself have come to a joint decision to refer to gastroenterology to help determine if an MRI would help narrow down the differential and to get their second opinion on whether or not these lesions could be causing her pain.Signs and symptoms requring emergent/urgent reevaluation were discussed  with the Mccullough.  For joint she's no longer needing to use any pain medication for her discomfort and never needed Zofran.  Return if symptoms worsen or fail to improve.

## 2014-12-17 NOTE — Progress Notes (Signed)
Pt notified to go to lab sometime this week or early next week for cbc/diff

## 2014-12-18 ENCOUNTER — Telehealth: Payer: Self-pay | Admitting: Family Medicine

## 2014-12-18 NOTE — Telephone Encounter (Signed)
Yesterday I had asked Seth Bake to let the patient know that I would recommend a repeat CBC to recheck her white blood cell count however would prefer to have this done on either Friday of this week or Monday of next week in order to space out the value from earlier this week. The patient arrived this afternoon wanting to have the test done today and the above advice was re-communicated to her and I'm making this is to document this recommendation in her chart.

## 2014-12-26 ENCOUNTER — Other Ambulatory Visit: Payer: Self-pay | Admitting: Gastroenterology

## 2014-12-26 DIAGNOSIS — R935 Abnormal findings on diagnostic imaging of other abdominal regions, including retroperitoneum: Secondary | ICD-10-CM

## 2014-12-27 ENCOUNTER — Telehealth: Payer: Self-pay

## 2014-12-27 ENCOUNTER — Other Ambulatory Visit: Payer: Self-pay

## 2014-12-27 DIAGNOSIS — K769 Liver disease, unspecified: Secondary | ICD-10-CM

## 2014-12-27 NOTE — Telephone Encounter (Signed)
Patient's mom called and left a message asking about a MRI of the Shoulder. I do not see an order. I returned call and left a message for her to call back.

## 2014-12-28 LAB — CBC WITH DIFFERENTIAL/PLATELET
Basophils Absolute: 0 10*3/uL (ref 0.0–0.1)
Basophils Relative: 0 % (ref 0–1)
EOS ABS: 0.1 10*3/uL (ref 0.0–0.7)
Eosinophils Relative: 1 % (ref 0–5)
HEMATOCRIT: 40 % (ref 36.0–46.0)
Hemoglobin: 13.1 g/dL (ref 12.0–15.0)
Lymphocytes Relative: 27 % (ref 12–46)
Lymphs Abs: 2.6 10*3/uL (ref 0.7–4.0)
MCH: 26.7 pg (ref 26.0–34.0)
MCHC: 32.8 g/dL (ref 30.0–36.0)
MCV: 81.6 fL (ref 78.0–100.0)
MONOS PCT: 8 % (ref 3–12)
MPV: 9.3 fL (ref 8.6–12.4)
Monocytes Absolute: 0.8 10*3/uL (ref 0.1–1.0)
Neutro Abs: 6.1 10*3/uL (ref 1.7–7.7)
Neutrophils Relative %: 64 % (ref 43–77)
PLATELETS: 363 10*3/uL (ref 150–400)
RBC: 4.9 MIL/uL (ref 3.87–5.11)
RDW: 13.2 % (ref 11.5–15.5)
WBC: 9.5 10*3/uL (ref 4.0–10.5)

## 2015-01-06 ENCOUNTER — Ambulatory Visit (INDEPENDENT_AMBULATORY_CARE_PROVIDER_SITE_OTHER): Payer: BLUE CROSS/BLUE SHIELD

## 2015-01-06 DIAGNOSIS — D734 Cyst of spleen: Secondary | ICD-10-CM

## 2015-01-06 DIAGNOSIS — N281 Cyst of kidney, acquired: Secondary | ICD-10-CM

## 2015-01-06 DIAGNOSIS — R932 Abnormal findings on diagnostic imaging of liver and biliary tract: Secondary | ICD-10-CM

## 2015-01-06 DIAGNOSIS — R935 Abnormal findings on diagnostic imaging of other abdominal regions, including retroperitoneum: Secondary | ICD-10-CM

## 2015-01-06 MED ORDER — GADOBENATE DIMEGLUMINE 529 MG/ML IV SOLN
14.0000 mL | Freq: Once | INTRAVENOUS | Status: AC | PRN
  Administered 2015-01-06: 14 mL via INTRAVENOUS

## 2015-01-07 ENCOUNTER — Ambulatory Visit: Payer: Self-pay | Admitting: Pediatrics

## 2015-03-17 ENCOUNTER — Other Ambulatory Visit: Payer: Self-pay

## 2015-03-17 DIAGNOSIS — M5412 Radiculopathy, cervical region: Secondary | ICD-10-CM

## 2015-03-29 ENCOUNTER — Ambulatory Visit (HOSPITAL_BASED_OUTPATIENT_CLINIC_OR_DEPARTMENT_OTHER)
Admission: RE | Admit: 2015-03-29 | Discharge: 2015-03-29 | Disposition: A | Discharge status: Home / Self Care | Payer: BLUE CROSS/BLUE SHIELD | Source: Ambulatory Visit | Attending: Sports Medicine | Admitting: Sports Medicine

## 2015-03-29 DIAGNOSIS — M25511 Pain in right shoulder: Secondary | ICD-10-CM | POA: Insufficient documentation

## 2015-03-29 DIAGNOSIS — M79601 Pain in right arm: Secondary | ICD-10-CM | POA: Insufficient documentation

## 2015-03-29 DIAGNOSIS — R531 Weakness: Secondary | ICD-10-CM | POA: Diagnosis not present

## 2015-03-29 DIAGNOSIS — M542 Cervicalgia: Secondary | ICD-10-CM | POA: Diagnosis present

## 2015-03-29 DIAGNOSIS — M5489 Other dorsalgia: Secondary | ICD-10-CM | POA: Insufficient documentation

## 2015-04-21 ENCOUNTER — Encounter: Payer: Self-pay | Admitting: Family Medicine

## 2015-04-21 ENCOUNTER — Ambulatory Visit (INDEPENDENT_AMBULATORY_CARE_PROVIDER_SITE_OTHER): Payer: BLUE CROSS/BLUE SHIELD | Admitting: Family Medicine

## 2015-04-21 VITALS — BP 112/74 | HR 71 | Wt 148.0 lb

## 2015-04-21 DIAGNOSIS — R9431 Abnormal electrocardiogram [ECG] [EKG]: Secondary | ICD-10-CM | POA: Diagnosis not present

## 2015-04-21 DIAGNOSIS — R002 Palpitations: Secondary | ICD-10-CM

## 2015-04-21 DIAGNOSIS — Z111 Encounter for screening for respiratory tuberculosis: Secondary | ICD-10-CM

## 2015-04-21 DIAGNOSIS — R0789 Other chest pain: Secondary | ICD-10-CM | POA: Diagnosis not present

## 2015-04-21 NOTE — Progress Notes (Signed)
Subjective:    Mccullough ID: Sierra Mccullough, female    DOB: [birth year], 24 y.o.   MRN: [id number]  HPI Had a cardiology app she felt some flip-flops in her chest and felt it was stress related.  Happens randomly.  Occ will get a burning sensation in mid sternum and happens with the flip flop senstaion.Marland Kitchen  Hx of trace MV leakage.  Seh only has minimal caffeine.  She has actually cut back. She was taking - pre-op. No related to eating.  She is taking miralx on constipation.  Not a new issue. Has been going on for awhile. Denies any GERD. No other known triggers.    Review of Systems  BP 112/74 mmHg  Pulse 71  Wt 148 lb (67.132 kg)    Allergies  Allergen Reactions  . Keflex [Cephalexin] Diarrhea and Other (See Comments)    Abdominal pain   . Sulfonamide Derivatives Other (See Comments)    Unknown reaction    Past Medical History  Diagnosis Date  . Nephrolithiasis   . Anxiety   . Depression   . Dyspareunia   . Migraines     Past Surgical History  Procedure Laterality Date  . Urethral stent  2011    due to kidney stones    History   Social History  . Marital Status: Single    Spouse Name: N/A  . Number of Children: N/A  . Years of Education: N/A   Occupational History  . Not on file.   Social History Main Topics  . Smoking status: Never Smoker   . Smokeless tobacco: Never Used  . Alcohol Use: No  . Drug Use: No  . Sexual Activity: No   Other Topics Concern  . Not on file   Social History Narrative   Born in [place].  Mother is Writer adn father is in Architect.  Lives with mother Sierra Mccullough and sister Sierra Mccullough.     Family History  Problem Relation Age of Onset  . Hypertension Mother   . Diabetes Mother   . Migraines Mother   . Diabetes Maternal Grandfather     Outpatient Encounter Prescriptions as of 04/21/2015  Medication Sig  . [DISCONTINUED] ondansetron (ZOFRAN ODT) 4 MG disintegrating tablet 4mg  ODT q4 hours prn nausea/vomit  .  [DISCONTINUED] oxyCODONE-acetaminophen (PERCOCET/ROXICET) 5-325 MG per tablet Take 1-2 tablets by mouth every 4 (four) hours as needed for severe pain.   No facility-administered encounter medications on file as of 04/21/2015.          Objective:   Physical Exam  Constitutional: She is oriented to person, place, and time. She appears well-developed and well-nourished.  HENT:  Head: Normocephalic and atraumatic.  Cardiovascular: Normal rate, regular rhythm and normal heart sounds.   No carotid bruits.   Pulmonary/Chest: Effort normal and breath sounds normal.  Neurological: She is alert and oriented to person, place, and time.  Skin: Skin is warm and dry.  Psychiatric: She has a normal mood and affect. Her behavior is normal.          Assessment & Plan:  Palpitations-there are several copies of EKGs that were done at work. No PACs or PVCs. But based on her description I suspect this is the most likely cause. We will do some blood work to rule out thyroid disorder and check for anemia. I'll go ahead and set her up for a 48 hour cardiac monitor as she definitely feels the sensation at least daily if not  more. Next  Atypical chest pain-doesn't sound cardiac in nature as often happens at rest and she is More As a Burning Sensation. I Would Still Prefer That She At [facility] Have a Consult with Cardiology.   Abnormal EKG-meets criteria for left ventricular hypertrophy. Rate of 71 bpm, with normal sinus arrhythmia.  I suspect that this is benign. But again with her recent symptoms of palpitations and chest pain I think we she get a cardiology consult just to be thorough.

## 2015-04-22 LAB — CBC
HCT: 38.4 % (ref 36.0–46.0)
Hemoglobin: 12.8 g/dL (ref 12.0–15.0)
MCH: 27 pg (ref 26.0–34.0)
MCHC: 33.3 g/dL (ref 30.0–36.0)
MCV: 81 fL (ref 78.0–100.0)
MPV: 10 fL (ref 8.6–12.4)
Platelets: 329 10*3/uL (ref 150–400)
RBC: 4.74 MIL/uL (ref 3.87–5.11)
RDW: 13.9 % (ref 11.5–15.5)
WBC: 8.7 10*3/uL (ref 4.0–10.5)

## 2015-04-22 LAB — FERRITIN: FERRITIN: 33 ng/mL (ref 10–291)

## 2015-04-22 LAB — TSH: TSH: 1.869 u[IU]/mL (ref 0.350–4.500)

## 2015-04-23 LAB — TB SKIN TEST
Induration: 0 mm
TB Skin Test: NEGATIVE

## 2015-04-23 NOTE — Addendum Note (Signed)
Addended by: Beatrice Lecher D on: 04/23/2015 01:40 PM   Modules accepted: Orders

## 2015-05-01 ENCOUNTER — Ambulatory Visit (INDEPENDENT_AMBULATORY_CARE_PROVIDER_SITE_OTHER): Payer: BLUE CROSS/BLUE SHIELD

## 2015-05-01 DIAGNOSIS — R0789 Other chest pain: Secondary | ICD-10-CM | POA: Diagnosis not present

## 2015-05-01 DIAGNOSIS — R002 Palpitations: Secondary | ICD-10-CM | POA: Diagnosis not present

## 2015-05-01 DIAGNOSIS — R9431 Abnormal electrocardiogram [ECG] [EKG]: Secondary | ICD-10-CM

## 2015-05-05 ENCOUNTER — Encounter: Payer: Self-pay | Admitting: Cardiology

## 2015-05-05 ENCOUNTER — Ambulatory Visit (INDEPENDENT_AMBULATORY_CARE_PROVIDER_SITE_OTHER): Payer: BLUE CROSS/BLUE SHIELD | Admitting: Cardiology

## 2015-05-05 VITALS — BP 140/82 | HR 66 | Ht 63.0 in | Wt 145.0 lb

## 2015-05-05 DIAGNOSIS — R9431 Abnormal electrocardiogram [ECG] [EKG]: Secondary | ICD-10-CM | POA: Diagnosis not present

## 2015-05-05 DIAGNOSIS — R002 Palpitations: Secondary | ICD-10-CM

## 2015-05-05 NOTE — Progress Notes (Signed)
Cardiology Office Note   Date:  05/05/2015   ID:  Sierra Mccullough, DOB 10/04/1991, MRN [id number]  PCP:  Beatrice Lecher, MD  Cardiologist:   Minus Breeding, MD   Chief Complaint  Patient presents with  . Palpitations      History of Present Illness: Sierra Mccullough is a 24 y.o. female who presents for evaluation of palpitations. She had syncope about 6 years ago and had an echocardiogram that demonstrated some mild valve leaking but she otherwise has no history of heart disease. 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 She does get palpitations. These have been going on for some time. She might have 3-5 per day. She feels strong beats that skip but not sustained rapid rhythms. She does not have syncope or presyncope. She does occasionally have some chest discomfort. This is sharp and mid to left chest. He comes with a quivering sensation. It happens at rest and might happen with the palpitations. There are no associated symptoms. There is no discomfort into her jaw or to her arms. She has no shortness of breath, PND or orthopnea. She is wearing a Holter monitor. She can exercise without bringing on any of the symptoms.  Of note I did review a family history provided by her father. She does have a history as listed below with a history of early coronary disease in paternal uncle and valvular disease in a couple of paternal relatives including her paternal grandfather. I don't have full details of this.  I do have some genetic testing was provided by her father. 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 The patient herself has never been screened genetically. Her father has had ventricular tachycardia "leaking heart valves". He is on a beta blocker.   Past Medical History  Diagnosis Date  .  Nephrolithiasis   . Dyspareunia   . Migraines     Past Surgical History  Procedure Laterality Date  . Urethral stent  2011    due to kidney stones     No current outpatient prescriptions on file.   No current facility-administered medications for this visit.    Allergies:   Keflex and Sulfonamide derivatives    Social History:  The patient  reports that she has never smoked. She has never used smokeless tobacco. She reports that she does not drink alcohol or use illicit drugs.   Family History:  The patient's family history includes Diabetes in her maternal grandfather and mother; Heart disease in her father, paternal aunt, and paternal grandfather; Heart disease (age of onset: 37) in her paternal uncle; Hypertension in her mother; Migraines in her mother.    ROS:  Please see the history of present illness.   Otherwise, review of systems are positive for none.   All other systems are reviewed and negative.    PHYSICAL EXAM: VS:  BP 140/82 mmHg  Pulse 66  Ht 5\' 3"  (1.6 m)  Wt 145 lb (65.772 kg)  BMI 25.69 kg/m2 , BMI Body mass index is 25.69 kg/(m^2). GENERAL:  Well appearing HEENT:  Pupils equal round and reactive, fundi not visualized, oral mucosa unremarkable NECK:  No jugular venous distention, waveform within normal limits, carotid upstroke brisk and symmetric, no bruits, no thyromegaly LYMPHATICS:  No cervical, inguinal adenopathy LUNGS:  Clear to auscultation bilaterally BACK:  No  CVA tenderness CHEST:  Unremarkable HEART:  PMI not displaced or sustained,S1 and S2 within normal limits, no S3, no S4, no clicks, no rubs, no murmurs ABD:  Flat, positive bowel sounds normal in frequency in pitch, no bruits, no rebound, no guarding, no midline pulsatile mass, no hepatomegaly, no splenomegaly EXT:  2 plus pulses throughout, no edema, no cyanosis no clubbing SKIN:  No rashes no nodules NEURO:  Cranial nerves II through XII grossly intact, motor grossly intact  throughout PSYCH:  Cognitively intact, oriented to person place and time    EKG:  EKG is not ordered today. The ekg ordered 6/1 demonstrates sinus rhythm with sinus arrhythmia, minimal voltage criteria for LVH with probable repolarization changes   Recent Labs: 12/16/2014: ALT 33; BUN 15; Creatinine 0.69; Potassium 3.3*; Sodium 139 04/21/2015: Hemoglobin 12.8; Platelets 329; TSH 1.869    Lipid Panel No results found for: CHOL, TRIG, HDL, CHOLHDL, VLDL, LDLCALC, LDLDIRECT    Wt Readings from Last 3 Encounters:  05/05/15 145 lb (65.772 kg)  04/21/15 148 lb (67.132 kg)  01/06/15 150 lb (68.04 kg)      Other studies Reviewed: Additional studies/ records that were reviewed today include: Old EKG and labs. Review of the above records demonstrates:  Please see elsewhere in the note.     ASSESSMENT AND PLAN:  ABNORMAL EKG:  Given the family history on the mildly abnormal EKG I will check an echocardiogram. However, I don't suspect structural heart disease at this point. I will follow her symptomatically however.  PALPITATIONS:  She is wearing a 48 hour Holter monitor and I will try to find this when the results are submitted. I did review labs and she has had a normal TSH and electrolytes. No change in therapy is indicated at this point. We talked about symptomatic treatment of ectopic beats but she would prefer none at this point.   Current medicines are reviewed at length with the patient today.  The patient does not have concerns regarding medicines.  The following changes have been made:  no change  Labs/ tests ordered today include:   Orders Placed This Encounter  Procedures  . ECHOCARDIOGRAM COMPLETE     Disposition:   FU with me as needed.     Signed, Minus Breeding, MD  05/05/2015 8:05 AM    [place]

## 2015-05-05 NOTE — Patient Instructions (Signed)
Your physician has requested that you have an echocardiogram. Echocardiography is a painless test that uses sound waves to create images of your heart. It provides your doctor with information about the size and shape of your heart and how well your heart's chambers and valves are working. This procedure takes approximately one hour. There are no restrictions for this procedure.  Dr Percival Spanish recommends that you schedule a follow-up appointment in 6 months. You will receive a reminder letter in the mail two months in advance. If you don't receive a letter, please call our office to schedule the follow-up appointment.

## 2015-05-06 ENCOUNTER — Encounter: Payer: Self-pay | Admitting: Cardiology

## 2015-05-13 ENCOUNTER — Ambulatory Visit (HOSPITAL_COMMUNITY)
Admission: RE | Admit: 2015-05-13 | Discharge: 2015-05-13 | Disposition: A | Discharge status: Home / Self Care | Payer: BLUE CROSS/BLUE SHIELD | Source: Ambulatory Visit | Attending: Internal Medicine | Admitting: Internal Medicine

## 2015-05-13 DIAGNOSIS — R9431 Abnormal electrocardiogram [ECG] [EKG]: Secondary | ICD-10-CM

## 2015-05-13 DIAGNOSIS — R002 Palpitations: Secondary | ICD-10-CM | POA: Diagnosis not present

## 2015-05-13 DIAGNOSIS — Z8249 Family history of ischemic heart disease and other diseases of the circulatory system: Secondary | ICD-10-CM | POA: Diagnosis not present

## 2015-05-15 ENCOUNTER — Telehealth: Payer: Self-pay | Admitting: Cardiology

## 2015-05-15 NOTE — Telephone Encounter (Signed)
Pt says that she received a call from our office and she thought it may have been in reference to her echo. Please f/u   Thanks

## 2015-05-15 NOTE — Telephone Encounter (Signed)
Returned call to patient echo results given. 

## 2015-06-06 ENCOUNTER — Encounter: Payer: Self-pay | Admitting: Obstetrics and Gynecology

## 2015-06-06 ENCOUNTER — Ambulatory Visit (INDEPENDENT_AMBULATORY_CARE_PROVIDER_SITE_OTHER): Payer: BLUE CROSS/BLUE SHIELD | Admitting: Obstetrics and Gynecology

## 2015-06-06 VITALS — BP 110/70 | HR 76 | Resp 16 | Ht 64.0 in | Wt 144.4 lb

## 2015-06-06 DIAGNOSIS — N949 Unspecified condition associated with female genital organs and menstrual cycle: Secondary | ICD-10-CM | POA: Diagnosis not present

## 2015-06-06 DIAGNOSIS — Z0000 Encounter for general adult medical examination without abnormal findings: Secondary | ICD-10-CM | POA: Diagnosis not present

## 2015-06-06 DIAGNOSIS — Z01419 Encounter for gynecological examination (general) (routine) without abnormal findings: Secondary | ICD-10-CM | POA: Diagnosis not present

## 2015-06-06 DIAGNOSIS — R102 Pelvic and perineal pain: Secondary | ICD-10-CM

## 2015-06-06 DIAGNOSIS — N898 Other specified noninflammatory disorders of vagina: Secondary | ICD-10-CM | POA: Diagnosis not present

## 2015-06-06 LAB — POCT URINALYSIS DIPSTICK
Bilirubin, UA: NEGATIVE
Blood, UA: NEGATIVE
Glucose, UA: NEGATIVE
Ketones, UA: NEGATIVE
Leukocytes, UA: NEGATIVE
Nitrite, UA: NEGATIVE
Protein, UA: NEGATIVE
Urobilinogen, UA: NEGATIVE
pH, UA: 6

## 2015-06-06 NOTE — Progress Notes (Signed)
Patient ID: Sierra Mccullough, female   DOB: [birth year], 24 y.o.   MRN: [id number] 23 y.o. G0P0 Single Caucasian female here for annual exam.    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 Fill follow up with GI yearly.  During work up for her pain had a Ct scan in January showing free fluid around the right ovary.  Was told she may have had a cyst rupture.  No masses seen in adnexa. Having left sided cramping now.  Had cramping down into her legs and she thinks she is ovulating.  Perineum sore for one year.  Wants to know what is going on. Wants retesting for bacterial vaginosis.  Had abdominal cramping the last time she had this.   Graduating in May next year.  Would like to work at [employer] in nursing.   PCP:  Beatrice Lecher, MD - labs with PCP.   Patient's last menstrual period was 05/26/2015 (exact date).          Sexually active: No. female The current method of family planning is abstinence.   Not active for over one year.   Exercising: Yes.    gym,cardio and weights. Smoker:  no  Health Maintenance: Pap:  Never History of abnormal Pap:  n/a MMG:  n/a Colonoscopy:  n/a BMD:   n/a  Result  n/a TDaP:  2013 Screening Labs:  Hb today: PCP, Urine today: neg  Gardasil vaccine - not done.    reports that she has never smoked. She has never used smokeless tobacco. She reports that she drinks alcohol. She reports that she does not use illicit drugs.  Past Medical History  Diagnosis Date  . Nephrolithiasis   . Dyspareunia   . Migraines     Past Surgical History  Procedure Laterality Date  . Urethral stent  2011    due to kidney stones    No current outpatient prescriptions on file.   No current facility-administered medications for this visit.    Family History  Problem Relation Age of Onset  . Hypertension Mother   . Diabetes Mother   . Migraines Mother   . Diabetes Maternal Grandfather   . Heart disease Paternal  Grandfather     Heart valved disease, died of MI age 64  . Heart disease Father     "ventricular tachycardia"  treated with beta blockers.   . Heart disease Paternal Uncle 24    ICD, MI  . Heart disease Paternal Aunt     Needs valve replacment CHF    ROS:  Pertinent items are noted in HPI.  Otherwise, a comprehensive ROS was negative.  Exam:   BP 110/70 mmHg  Pulse 76  Resp 16  Ht 5\' 4"  (1.626 m)  Wt 144 lb 6.4 oz (65.499 kg)  BMI 24.77 kg/m2  LMP 05/26/2015 (Exact Date)    General appearance: alert, cooperative and appears stated age Head: Normocephalic, without obvious abnormality, atraumatic Neck: no adenopathy, supple, symmetrical, trachea midline and thyroid normal to inspection and palpation Lungs: clear to auscultation bilaterally Breasts: normal appearance, no masses or tenderness, Inspection negative, No nipple retraction or dimpling, No nipple discharge or bleeding, No axillary or supraclavicular adenopathy Heart: regular rate and rhythm Abdomen: soft, non-tender; bowel sounds normal; no masses,  no organomegaly Extremities: extremities normal, atraumatic, no cyanosis or edema Skin: Skin color, texture, turgor normal. No rashes or lesions Lymph nodes: Cervical, supraclavicular, and axillary nodes normal. No abnormal inguinal nodes palpated  Neurologic: Grossly normal  Pelvic: External genitalia:  no lesions              Urethra:  normal appearing urethra with no masses, tenderness or lesions              Bartholins and Skenes: normal                 Vagina: normal appearing vagina with normal color and discharge, no lesions              Cervix: no lesions              Pap taken: Yes.   Bimanual Exam:  Uterus:  normal size, contour, position, consistency, mobility, non-tender              Adnexa: normal adnexa and no mass, fullness, tenderness              Rectovaginal: No..    Chaperone was present for exam.  Assessment:   Well woman visit with normal  exam. Pelvic cramping.  History of recent probable ruptured ovarian cyst.  Vaginal discharge Perineal pain. No lesions.  Focal nodular hyperplasia of the liver.  Off combined OCPs.  Plan: Yearly mammogram recommended after age 29.  Recommended self breast exam.  Pap and HR HPV as above. Information on Calcium, Vitamin D, regular exercise program including cardiovascular and weight bearing exercise. Labs performed.  Yes.  .   See orders.  Affirm testing.  Refills given on medications.  No..    Will have patient use hydrocortisone cream OTC 0.5 - 1 % to area bid for 2 weeks. If pain not improved will consider vaginal estrogen cream.  Discussed Skyla IUD for dysmenorrhea.   HO to patient.  Follow up annually and prn.   Additional counseling given regarding pelvic cramping, vaginal discharge, and perineal pain.  15 minutes additional time and over 50% spent in counseling.   After visit summary provided.

## 2015-06-06 NOTE — Patient Instructions (Addendum)
EXERCISE AND DIET:  We recommended that you start or continue a regular exercise program for good health. Regular exercise means any activity that makes your heart beat faster and makes you sweat.  We recommend exercising at least 30 minutes per day at least 3 days a week, preferably 4 or 5.  We also recommend a diet low in fat and sugar.  Inactivity, poor dietary choices and obesity can cause diabetes, heart attack, stroke, and kidney damage, among others.    ALCOHOL AND SMOKING:  Women should limit their alcohol intake to no more than 7 drinks/beers/glasses of wine (combined, not each!) per week. Moderation of alcohol intake to this level decreases your risk of breast cancer and liver damage. And of course, no recreational drugs are part of a healthy lifestyle.  And absolutely no smoking or even second hand smoke. Most people know smoking can cause heart and lung diseases, but did you know it also contributes to weakening of your bones? Aging of your skin?  Yellowing of your teeth and nails?  CALCIUM AND VITAMIN D:  Adequate intake of calcium and Vitamin D are recommended.  The recommendations for exact amounts of these supplements seem to change often, but generally speaking 600 mg of calcium (either carbonate or citrate) and 800 units of Vitamin D per day seems prudent. Certain women may benefit from higher intake of Vitamin D.  If you are among these women, your doctor will have told you during your visit.    PAP SMEARS:  Pap smears, to check for cervical cancer or precancers,  have traditionally been done yearly, although recent scientific advances have shown that most women can have pap smears less often.  However, every woman still should have a physical exam from her gynecologist every year. It will include a breast check, inspection of the vulva and vagina to check for abnormal growths or skin changes, a visual exam of the cervix, and then an exam to evaluate the size and shape of the uterus and  ovaries.  And after 22 years of age, a rectal exam is indicated to check for rectal cancers. We will also provide age appropriate advice regarding health maintenance, like when you should have certain vaccines, screening for sexually transmitted diseases, bone density testing, colonoscopy, mammograms, etc.   MAMMOGRAMS:  All women over 40 years old should have a yearly mammogram. Many facilities now offer a "3D" mammogram, which may cost around $50 extra out of pocket. If possible,  we recommend you accept the option to have the 3D mammogram performed.  It both reduces the number of women who will be called back for extra views which then turn out to be normal, and it is better than the routine mammogram at detecting truly abnormal areas.    COLONOSCOPY:  Colonoscopy to screen for colon cancer is recommended for all women at age 50.  We know, you hate the idea of the prep.  We agree, BUT, having colon cancer and not knowing it is worse!!  Colon cancer so often starts as a polyp that can be seen and removed at colonscopy, which can quite literally save your life!  And if your first colonoscopy is normal and you have no family history of colon cancer, most women don't have to have it again for 10 years.  Once every ten years, you can do something that may end up saving your life, right?  We will be happy to help you get it scheduled when you are ready.    Be sure to check your insurance coverage so you understand how much it will cost.  It may be covered as a preventative service at no cost, but you should check your particular policy.     HPV Vaccine Cervarix (Human Papillomavirus): What You Need to Know 1. What is HPV? Genital human papillomavirus (HPV) is the most common sexually transmitted virus in the [place]. More than half of sexually active men and women are infected with HPV at some time in their lives. About 20 million Americans are currently infected, and about 6 million more get infected  each year. HPV is usually spread through sexual contact. Most HPV infections don't cause any symptoms, and go away on their own. But HPV can cause cervical cancer in women. Cervical cancer is the 2nd leading cause of cancer deaths among women around the world. In the [place], about 10,000 women get cervical cancer every year and about 4,000 are expected to die from it. HPV is also associated with several less common cancers, such as vaginal and vulvar cancers in women and other types of cancer in both men and women. It can also cause genital warts and warts in the throat. There is no cure for HPV infection, but some of the problems it causes can be treated. 2. HPV vaccine: Why get vaccinated? HPV vaccine is important because it can prevent most cases of cervical cancer in females, if it is given before a person is exposed to the virus. Protection from HPV vaccine is expected to be long-lasting. But vaccination is not a substitute for cervical cancer screening. Women should still get regular Pap tests. The vaccine you are getting is one of two HPV vaccines that can be given to prevent cervical cancer. It is given to females only. The other vaccine may be given to both males and females. It can also prevent most genital warts. It has also been shown to prevent some vaginal, vulvar and anal cancers. 3. Who should get this HPV vaccine and when? Routine vaccination  HPV vaccine is recommended for girls 103 or 22 years of age. It may be given to girls starting at age 65. Why is HPV vaccine given to girls at this age? It is important for girls to get HPV vaccine before their first sexual contact--because they won't have been exposed to human papillomavirus. Once a girl or woman has been infected with the virus, the vaccine might not work as well or might not work at all. Catch-up vaccination  The vaccine is also recommended for girls and women 20 through 22 years of age who did not get all 3 doses  when they were younger. HPV vaccine is given as a 3-dose series  1st Dose: Now  2nd Dose: 1 to 2 months after Dose 1  3rd Dose: 6 months after Dose 1 Additional (booster) doses are not recommended. HPV vaccine may be given at the same time as other vaccines. 4. Some people should not get HPV vaccine or should wait  Anyone who has ever had a life-threatening allergic reaction to any component of HPV vaccine, or to a previous dose of HPV vaccine, should not get the vaccine. Tell your doctor if the person getting vaccinated has any severe allergies, including an allergy to latex.  HPV vaccine is not recommended for pregnant women. However, receiving HPV vaccine when pregnant is not a reason to consider terminating the pregnancy. Women who are breast feeding may get the vaccine.  Any woman who learns  she was pregnant when she got this HPV vaccine is encouraged to contact the manufacturer's HPV in pregnancy registry at [phone]. This will help [place] learn how pregnant women respond to the vaccine.  People who are mildly ill when a dose of HPV is planned can still be vaccinated. People with a moderate or severe illness should wait until they are better. 5. What are the risks from this vaccine? This HPV vaccine has been in use around the world for several years and has been very safe. However, any medicine could possibly cause a serious problem, such as a severe allergic reaction. The risk of any vaccine causing a serious injury, or death, is extremely small. Life-threatening allergic reactions from vaccines are very rare. If they do occur, it would be within a few minutes to a few hours after the vaccination. Several mild to moderate problems are known to occur with HPV vaccine. These do not last long and go away on their own.  Reactions where the shot was given:  Pain (about 9 people in 10)  Redness or swelling (about 1 person in 2)  Other mild reactions:  Fever of 99.27F or higher  (about 1 person in 8)  Headache or fatigue (about 1 person in 2)  Nausea, vomiting, diarrhea, or abdominal pain (about 1 person in 4)  Muscle or joint pain (up to 1 person in 2)  Fainting:  Brief fainting spells and related symptoms (such as jerking movements) can happen after any medical procedure, including vaccination. Sitting or lying down for about 15 minutes after a vaccination can help prevent fainting and injuries caused by falls. Tell your doctor if the patient feels dizzy or light-headed, or has vision changes or ringing in the ears.  Like all vaccines, HPV vaccines will continue to be monitored for unusual or severe problems. 6. What if there is a serious reaction? What should I look for?  Look for anything that concerns you, such as signs of a severe allergic reaction, very high fever, or behavior changes. Signs of a severe allergic reaction can include hives, swelling of the face and throat, difficulty breathing, a fast heartbeat, dizziness, and weakness. These would start a few minutes to a few hours after the vaccination.  What should I do?  If you think it is a severe allergic reaction or other emergency that can't wait, call 9-1-1 or get the person to the nearest hospital. Otherwise, call your doctor.  Afterward, the reaction should be reported to the Vaccine Adverse Event Reporting System (VAERS). Your doctor might file this report, or you can do it yourself through the VAERS web site at www.vaers.SamedayNews.es, or by calling 940-761-2694. VAERS is only for reporting reactions. They do not give medical advice. 7. The National Vaccine Injury Compensation Program The [employer] Vaccine Injury Compensation Program (VICP) is a federal program that was created to compensate people who may have been injured by certain vaccines. Persons who believe they may have been injured by a vaccine can learn about the program and about filing a claim by calling (416)134-8230 or visiting the [place]  website at GoldCloset.com.ee. 8. How can I learn more?  Ask your doctor.  Call your local or state health department.  Contact the Centers for Disease Control and Prevention (CDC):  Call 254-167-2544 (1-800-CDC-INFO) or  Visit CDC's website at  http://hunter.com/ CDC Human Papillomavirus (HPV) Vaccine Cervarix VIS (03/31/10) Document Released: 04/03/2009 Document Revised: 04/01/2014 Document Reviewed: 12/27/2013 [facility] Patient Information 2015 [place], [place]. This information is not  intended to replace advice given to you by your health care provider. Make sure you discuss any questions you have with your health care provider.

## 2015-06-07 LAB — WET PREP BY MOLECULAR PROBE
Candida species: NEGATIVE
GARDNERELLA VAGINALIS: NEGATIVE
TRICHOMONAS VAG: NEGATIVE

## 2015-06-10 ENCOUNTER — Telehealth: Payer: Self-pay | Admitting: *Deleted

## 2015-06-10 LAB — IPS PAP TEST WITH REFLEX TO HPV

## 2015-06-10 NOTE — Telephone Encounter (Signed)
Left Message To Call Back  

## 2015-06-10 NOTE — Telephone Encounter (Signed)
Patient notified see result note 

## 2015-06-10 NOTE — Telephone Encounter (Signed)
-----   Message from Nunzio Cobbs, MD sent at 06/09/2015  8:34 PM EDT ----- Please let patient know that her Affirm testing was negative for vaginitis.  There is no sign of bacterial vaginosis.   Pap is pending.   Have her return if the perineal burning symptoms persist.  I recommended that she treat with over the counter hydrocortisone cream initially and then return if symptoms persist, so we can discuss other treatment options.

## 2015-06-10 NOTE — Telephone Encounter (Signed)
Return call to [place].

## 2015-06-25 ENCOUNTER — Encounter: Payer: Self-pay | Admitting: Family Medicine

## 2015-06-25 ENCOUNTER — Ambulatory Visit (INDEPENDENT_AMBULATORY_CARE_PROVIDER_SITE_OTHER): Payer: BLUE CROSS/BLUE SHIELD | Admitting: Family Medicine

## 2015-06-25 VITALS — BP 130/83 | HR 69 | Temp 98.1°F | Wt 146.0 lb

## 2015-06-25 DIAGNOSIS — J029 Acute pharyngitis, unspecified: Secondary | ICD-10-CM | POA: Diagnosis not present

## 2015-06-25 LAB — POCT RAPID STREP A (OFFICE): Rapid Strep A Screen: NEGATIVE

## 2015-06-25 MED ORDER — OMEPRAZOLE 40 MG PO CPDR: 40.0000 mg | DELAYED_RELEASE_CAPSULE | Freq: Every day | ORAL | Status: DC

## 2015-06-25 MED ORDER — AZITHROMYCIN 250 MG PO TABS: 250.0000 mg | ORAL_TABLET | Freq: Every day | ORAL | Status: DC

## 2015-06-25 MED ORDER — PREDNISONE 10 MG PO TABS: 30.0000 mg | ORAL_TABLET | Freq: Every day | ORAL | Status: DC

## 2015-06-25 MED ORDER — IPRATROPIUM BROMIDE 0.06 % NA SOLN: 2.0000 | Freq: Four times a day (QID) | NASAL | Status: DC

## 2015-06-25 NOTE — Patient Instructions (Signed)
Thank you for coming in today. Use azithromycin if not better.  Call or go to the emergency room if you get worse, have trouble breathing, have chest pains, or palpitations.   Pharyngitis Pharyngitis is redness, pain, and swelling (inflammation) of your pharynx.  CAUSES  Pharyngitis is usually caused by infection. Most of the time, these infections are from viruses (viral) and are part of a cold. However, sometimes pharyngitis is caused by bacteria (bacterial). Pharyngitis can also be caused by allergies. Viral pharyngitis may be spread from person to person by coughing, sneezing, and personal items or utensils (cups, forks, spoons, toothbrushes). Bacterial pharyngitis may be spread from person to person by more intimate contact, such as kissing.  SIGNS AND SYMPTOMS  Symptoms of pharyngitis include:   Sore throat.   Tiredness (fatigue).   Low-grade fever.   Headache.  Joint pain and muscle aches.  Skin rashes.  Swollen lymph nodes.  Plaque-like film on throat or tonsils (often seen with bacterial pharyngitis). DIAGNOSIS  Your health care provider will ask you questions about your illness and your symptoms. Your medical history, along with a physical exam, is often all that is needed to diagnose pharyngitis. Sometimes, a rapid strep test is done. Other lab tests may also be done, depending on the suspected cause.  TREATMENT  Viral pharyngitis will usually get better in 3-4 days without the use of medicine. Bacterial pharyngitis is treated with medicines that kill germs (antibiotics).  HOME CARE INSTRUCTIONS   Drink enough water and fluids to keep your urine clear or pale yellow.   Only take over-the-counter or prescription medicines as directed by your health care provider:   If you are prescribed antibiotics, make sure you finish them even if you start to feel better.   Do not take aspirin.   Get lots of rest.   Gargle with 8 oz of salt water ( tsp of salt per 1 qt  of water) as often as every 1-2 hours to soothe your throat.   Throat lozenges (if you are not at risk for choking) or sprays may be used to soothe your throat. SEEK MEDICAL CARE IF:   You have large, tender lumps in your neck.  You have a rash.  You cough up green, yellow-brown, or bloody spit. SEEK IMMEDIATE MEDICAL CARE IF:   Your neck becomes stiff.  You drool or are unable to swallow liquids.  You vomit or are unable to keep medicines or liquids down.  You have severe pain that does not go away with the use of recommended medicines.  You have trouble breathing (not caused by a stuffy nose). MAKE SURE YOU:   Understand these instructions.  Will watch your condition.  Will get help right away if you are not doing well or get worse. Document Released: 11/15/2005 Document Revised: 09/05/2013 Document Reviewed: 07/23/2013 [facility] Patient Information 2015 [place], [place]. This information is not intended to replace advice given to you by your health care provider. Make sure you discuss any questions you have with your health care provider.

## 2015-06-25 NOTE — Assessment & Plan Note (Signed)
Pharyngitis with globus sensation. Treat with Atrovent nasal spray prednisone omeprazole and use azithromycin if not better.

## 2015-06-25 NOTE — Progress Notes (Signed)
Sierra Mccullough is a 24 y.o. female who presents to [facility]  today for sore throat. Patient is a one-month long history of sore throat. It worsened over the past 2 days. Pain is located in the right side. She has pain with swallowing and occasionally feels like something stuck in her throat however she never has any actual dysphagia or difficulty swallowing. She denies any trouble speaking. She has not really tried any medications yet. No fevers chills nausea vomiting or diarrhea.   Past Medical History  Diagnosis Date  . Nephrolithiasis   . Dyspareunia   . Migraines    Past Surgical History  Procedure Laterality Date  . Urethral stent  2011    due to kidney stones   History  Substance Use Topics  . Smoking status: Never Smoker   . Smokeless tobacco: Never Used  . Alcohol Use: 0.0 oz/week    0 Standard drinks or equivalent per week     Comment: occ   ROS as above Medications: Current Outpatient Prescriptions  Medication Sig Dispense Refill  . azithromycin (ZITHROMAX) 250 MG tablet Take 1 tablet (250 mg total) by mouth daily. Take first 2 tablets together, then 1 every day until finished. 6 tablet 0  . ipratropium (ATROVENT) 0.06 % nasal spray Place 2 sprays into both nostrils 4 (four) times daily. 15 mL 1  . omeprazole (PRILOSEC) 40 MG capsule Take 1 capsule (40 mg total) by mouth daily. 30 capsule 3  . predniSONE (DELTASONE) 10 MG tablet Take 3 tablets (30 mg total) by mouth daily. 15 tablet 0   No current facility-administered medications for this visit.   Allergies  Allergen Reactions  . Keflex [Cephalexin] Diarrhea and Other (See Comments)    Abdominal pain   . Sulfonamide Derivatives Other (See Comments)    Unknown reaction     Exam:  BP 130/83 mmHg  Pulse 69  Temp(Src) 98.1 F (36.7 C)  Wt 146 lb (66.225 kg)  LMP 05/26/2015 (Exact Date) Gen: Well NAD HEENT: EOMI,  MMM posterior pharynx with cobblestoning normal-appearing  tonsils bilaterally. Tender palpation right cervical lymph nodes without significant palpable lymphadenopathy. Lungs: Normal work of breathing. CTABL Heart: RRR no MRG Abd: NABS, Soft. Nondistended, Nontender Exts: Brisk capillary refill, warm and well perfused.   Results for orders placed or performed in visit on 06/25/15 (from the past 24 hour(s))  POCT rapid strep A     Status: None   Collection Time: 06/25/15  1:40 PM  Result Value Ref Range   Rapid Strep A Screen Negative Negative   No results found.   Please see individual assessment and plan sections.

## 2015-07-16 ENCOUNTER — Ambulatory Visit: Payer: Self-pay | Admitting: Cardiology

## 2015-08-26 ENCOUNTER — Ambulatory Visit: Payer: Self-pay | Admitting: Pediatrics

## 2015-09-02 ENCOUNTER — Ambulatory Visit: Payer: BLUE CROSS/BLUE SHIELD | Admitting: Pediatrics

## 2015-10-06 ENCOUNTER — Telehealth: Payer: Self-pay | Admitting: Obstetrics and Gynecology

## 2015-10-06 NOTE — Telephone Encounter (Signed)
Patient wants to discuss her painful cycles and she also has a painful purple bump on her labia that is swollen.

## 2015-10-06 NOTE — Telephone Encounter (Signed)
Spoke with patient. Patient states that the past couple of months she has been experiencing increased cramping and "pressure" up to two weeks prior to her cycle. Has been experiencing intermittent left sided pain. Denies any current pain. Patient has noticed she also has bumps on her "labia" that come and go. Bumps are tender to the touch. Are usually white or purple in color. Advised she will need to be seen in office for further evaluation. Patient is agreeable. Appointment scheduled for 10/07/2015 at 2 pm with Melvia Heaps CNM as Dr.Silva will be out of the office tomorrow. Patient is agreeable to date and time and to see another provider.  Cc: Dr.Silva  Routing to provider for final review. Patient agreeable to disposition. Will close encounter.

## 2015-10-07 ENCOUNTER — Encounter: Payer: Self-pay | Admitting: Certified Nurse Midwife

## 2015-10-07 ENCOUNTER — Ambulatory Visit (INDEPENDENT_AMBULATORY_CARE_PROVIDER_SITE_OTHER): Payer: BLUE CROSS/BLUE SHIELD | Admitting: Certified Nurse Midwife

## 2015-10-07 VITALS — BP 118/76 | HR 72 | Resp 16 | Ht 64.0 in | Wt 148.0 lb

## 2015-10-07 DIAGNOSIS — Z113 Encounter for screening for infections with a predominantly sexual mode of transmission: Secondary | ICD-10-CM | POA: Diagnosis not present

## 2015-10-07 DIAGNOSIS — M25552 Pain in left hip: Secondary | ICD-10-CM

## 2015-10-07 LAB — POCT URINALYSIS DIPSTICK
Bilirubin, UA: NEGATIVE
GLUCOSE UA: NEGATIVE
Ketones, UA: NEGATIVE
Leukocytes, UA: NEGATIVE
Nitrite, UA: NEGATIVE
PROTEIN UA: NEGATIVE
RBC UA: NEGATIVE
UROBILINOGEN UA: NEGATIVE
pH, UA: 5

## 2015-10-07 NOTE — Patient Instructions (Signed)
Dysmenorrhea Dysmenorrhea is pain during a menstrual period. You will have pain in the lower belly (abdomen). The pain is caused by the tightening (contracting) of the muscles of the uterus. The pain can be minor or severe. Headache, feeling sick to your stomach (nausea), throwing up (vomiting), or low back pain may occur with this condition. HOME CARE  Only take medicine as told by your doctor.  Place a heating pad or hot water bottle on your lower back or belly. Do not sleep with a heating pad.  Exercise may help lessen the pain.  Massage the lower back or belly.  Stop smoking.  Avoid alcohol and caffeine. GET HELP IF:   Your pain does not get better with medicine.  You have pain during sex.  Your pain gets worse while taking pain medicine.  Your period bleeding is heavier than normal.  You keep feeling sick to your stomach or keep throwing up. GET HELP RIGHT AWAY IF: You pass out (faint).   This information is not intended to replace advice given to you by your health care provider. Make sure you discuss any questions you have with your health care provider.   Document Released: 02/11/2009 Document Revised: 11/20/2013 Document Reviewed: 05/03/2013 Elsevier Interactive Patient Education 2016 Elsevier Inc. Genital Herpes Genital herpes is a common sexually transmitted infection (STI) that is caused by a virus. The virus is spread from person to person through sexual contact. Infection can cause itching, blisters, and sores in the genital area or rectal area. This is called an outbreak. It affects both men and women. Genital herpes is particularly concerning for pregnant women because the virus can be passed to the baby during delivery and cause serious problems. Genital herpes is also a concern for people with a weakened defense (immune) system. Symptoms of genital herpes may last several days and then go away. However, the virus remains in your body, so you may have more  outbreaks of symptoms in the future. The time between outbreaks varies and can be months or years. CAUSES Genital herpes is caused by a virus called herpes simplex virus (HSV) type 2 or HSV type 1. These viruses are contagious and are most often spread through sexual contact with an infected person. Sexual contact includes vaginal, anal, and oral sex. RISK FACTORS Risk factors for genital herpes include:  Being sexually active with multiple partners.  Having unprotected sex. SIGNS AND SYMPTOMS Symptoms may include:  Pain and itching in the genital area or rectal area.  Small red bumps that turn into blisters and then turn into sores.  Flu-like symptoms, including:  Fever.  Body aches.  Painful urination.  Vaginal discharge. DIAGNOSIS Genital herpes may be diagnosed by:  Physical exam.  Blood test.  Fluid culture test from an open sore. TREATMENT There is no cure for genital herpes. Oral antiviral medicines may be used to speed up healing and to help prevent the return of symptoms. These medicines can also help to reduce the spread of the virus to sexual partners. HOME CARE INSTRUCTIONS  Keep the affected areas dry and clean.  Take medicines only as directed by your health care provider.  Do not have sexual contact during active infections. Genital herpes is contagious.  Practice safe sex. Latex condoms and female condoms may help to prevent the spread of the herpes virus.  Avoid rubbing or touching the blisters and sores. If you do touch the blister or sores:  Wash your hands thoroughly.  Do not touch your  eyes afterward.  If you become pregnant, tell your health care provider if you have had genital herpes.  Keep all follow-up visits as directed by your health care provider. This is important. PREVENTION  Use condoms. Although anyone can contract genital herpes during sexual contact even with the use of a condom, a condom can provide some protection.  Avoid  having multiple sexual partners.  Talk to your sexual partner about any symptoms and past history that either of you may have.  Get tested before you have sex. Ask your partner to do the same.  Recognize the symptoms of genital herpes. Do not have sexual contact if you notice these symptoms. SEEK MEDICAL CARE IF:  Your symptoms are not improving with medicine.  Your symptoms return.  You have new symptoms.  You have a fever.  You have abdominal pain.  You have redness, swelling, or pain in your eye. MAKE SURE YOU:  Understand these instructions.  Will watch your condition.  Will get help right away if you are not doing well or get worse.   This information is not intended to replace advice given to you by your health care provider. Make sure you discuss any questions you have with your health care provider.   Document Released: 11/12/2000 Document Revised: 12/06/2014 Document Reviewed: 04/02/2014 Elsevier Interactive Patient Education [employer].

## 2015-10-07 NOTE — Progress Notes (Signed)
24 y.o.Single white  g0p0 here with complaint of vaginal symptoms of bumps in vaginal and upper pubic area. Had noted this previously, now occurring again.Marland Kitchen Also had heavier bleeding this cycle only, with some pressure feeling and left side pain which comes and goes and not occurring now.. Had right ovarian cyst detected by [place] only, which had ruptured. This was noted on [place] for liver concerns.  Worried she has one on left side now. Denies diarrhea, vomiting, fever or chills.Always has nausea with her period not a concern. Desires STD screening, one new partner, condom use also.Marland Kitchen Urinary symptoms none . Contraception is condoms. No other concerns today.    O:Healthy female WDWN Affect: normal, orientation x 3 No obvious distress  Exam:Skin: warm and dry Abdomen: soft non tender, no masses palpated or point of tenderness Lymph node: no enlargement or tenderness in inguinal area Pelvic exam: External genital: normal female with small white pimple like bumps noted on right and left vulva area( 2 on right, one on left) non tender no redness Cultures taken from areas Bladder non tender, urethral meatus non tender BUS: negative Small herpetic appearing blister on left labia with edema and redness, culture taken Vagina: scant blood noted , with slight odor  Affirm taken Cervix: normal, non tender, no CMT, no blood from cervix noted Uterus: normal, non tender Adnexa:normal, non tender, no masses or fullness noted   A:Normal pelvic exam R/O HSV R/O MRSA STD screening   P:Discussed findings of ? HSV and etiology. Discussed Aveeno or epsom salt sitz bath for comfort.  Patient no history of HSV or previous partners that she is aware. Patient does work out and is aware of MRSA in community. 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 Discussed Valtrex use for symptoms. Patient declines unless she knows this is HSV.Questions addressed at length. Lab: HSV culture, MRSA culture, HSV,1,2, HIV,RPR, GC,Chlamydia. Discussed keeping  menses calendar and advising of next menses. If she begins with left side pain again with period suggest Motrin use for cramping. If unrelieved needs to advise. Questions addressed.   Rv prn

## 2015-10-08 LAB — HSV(HERPES SIMPLEX VRS) I + II AB-IGG: HSV 2 Glycoprotein G Ab, IgG: <0.1 IV

## 2015-10-08 LAB — HIV ANTIBODY (ROUTINE TESTING W REFLEX): HIV: NONREACTIVE

## 2015-10-08 LAB — RPR

## 2015-10-09 LAB — IPS N GONORRHOEA AND CHLAMYDIA BY PCR

## 2015-10-09 LAB — HERPES SIMPLEX VIRUS CULTURE: ORGANISM ID, BACTERIA: NOT DETECTED

## 2015-10-09 LAB — MRSA CULTURE

## 2015-10-10 ENCOUNTER — Ambulatory Visit (INDEPENDENT_AMBULATORY_CARE_PROVIDER_SITE_OTHER): Payer: BLUE CROSS/BLUE SHIELD | Admitting: Certified Nurse Midwife

## 2015-10-10 ENCOUNTER — Telehealth: Payer: Self-pay | Admitting: Emergency Medicine

## 2015-10-10 ENCOUNTER — Encounter: Payer: Self-pay | Admitting: Certified Nurse Midwife

## 2015-10-10 VITALS — BP 122/70 | HR 66 | Temp 99.3°F | Resp 16 | Wt 147.0 lb

## 2015-10-10 DIAGNOSIS — N390 Urinary tract infection, site not specified: Secondary | ICD-10-CM | POA: Diagnosis not present

## 2015-10-10 DIAGNOSIS — Z8742 Personal history of other diseases of the female genital tract: Secondary | ICD-10-CM

## 2015-10-10 MED ORDER — NITROFURANTOIN MONOHYD MACRO 100 MG PO CAPS: 100.0000 mg | ORAL_CAPSULE | Freq: Two times a day (BID) | ORAL | Status: DC

## 2015-10-10 NOTE — Patient Instructions (Signed)

## 2015-10-10 NOTE — Progress Notes (Signed)
24 y.o.Single white  g0p0 here with complaint of UTI, with onset  in last 24 hours. Has some pinching pain in urethral area.   Patient complaining urinary pressure and need to urinate. Has never had UTI before, so unsure. Patient denies fever, chills, nausea or back pain. No new personal products. Patient feels not related to sexual activity. Denies any vaginal symptoms.    Contraception is condoms. Patient is not consuming adequate water intake. Patient is still worried about having another ovarian cyst and had scheduled PUS but unable to do due to personal reasons. Would like to reschedule, she had seen Dr. Quincy Simmonds regarding. Seen 2 days ago with vaginal ? HSV area in labia area, used epsom salt tub bath and symptoms resolved. Culture negative for HSV and MRSA. Areas have resolved per patient. No other health issues today.   O: Healthy female WDWN Affect: Normal, orientation x 3 Skin : warm and dry CVAT: negative bilateral Abdomen: positive  for suprapubic tenderness  Pelvic exam: External genital area: normal, no lesions Bladder,Urethra tender, Urethral meatus: tender, red Vagina: normal vaginal discharge, normal appearance   Cervix: normal, non tender Uterus:normal,non tender Adnexa: normal non tender, no fullness or masses    A: UTI Normal pelvic exam History of ovarian cyst previous evaluation not completed for personal reason,would like to schedule now for follow  P: Reviewed findings of UTI symptoms  and need for treatment. [id number] see order with instructions Reviewed warning signs and symptoms of UTI and need to advise if occurring. Encouraged to limit soda, tea, and coffee and increase water intake. Discussed she will be contacted with insurance information and scheduled.  Rv prn as above   RV prn

## 2015-10-10 NOTE — Progress Notes (Signed)
Encounter reviewed Rebel Laughridge, MD   

## 2015-10-10 NOTE — Telephone Encounter (Signed)
-----   Message from Regina Eck, CNM sent at 10/10/2015 10:07 AM EST ----- Please read result  Note patient still feeling uncomfortable, will need to be seen today for evaluation

## 2015-10-10 NOTE — Telephone Encounter (Signed)
Call to patient. States she still is not improved and has another vaginal bump. She is scheduled for office visit today with Sierra Mccullough CNM at 1245.  Routing to provider for final review. Patient agreeable to disposition. Will close encounter.

## 2015-10-11 NOTE — Progress Notes (Signed)
Reviewed personally.  M. Suzanne Rikia Sukhu, MD.  

## 2015-10-15 ENCOUNTER — Telehealth: Payer: Self-pay | Admitting: Certified Nurse Midwife

## 2015-10-15 NOTE — Telephone Encounter (Signed)
Call to patient to discuss benefits for pelvic ultrasound. Left voicemail to return call.

## 2015-10-20 NOTE — Telephone Encounter (Signed)
2nd attempt  - Called patient to review benefits for procedure. Left voicemail to call back and review.

## 2015-11-06 ENCOUNTER — Ambulatory Visit (INDEPENDENT_AMBULATORY_CARE_PROVIDER_SITE_OTHER): Payer: BLUE CROSS/BLUE SHIELD

## 2015-11-06 ENCOUNTER — Ambulatory Visit (INDEPENDENT_AMBULATORY_CARE_PROVIDER_SITE_OTHER): Payer: BLUE CROSS/BLUE SHIELD | Admitting: Obstetrics and Gynecology

## 2015-11-06 ENCOUNTER — Encounter: Payer: Self-pay | Admitting: Obstetrics and Gynecology

## 2015-11-06 VITALS — BP 110/62 | HR 70 | Ht 64.0 in | Wt 146.0 lb

## 2015-11-06 DIAGNOSIS — K625 Hemorrhage of anus and rectum: Secondary | ICD-10-CM | POA: Diagnosis not present

## 2015-11-06 DIAGNOSIS — N921 Excessive and frequent menstruation with irregular cycle: Secondary | ICD-10-CM

## 2015-11-06 DIAGNOSIS — R1032 Left lower quadrant pain: Secondary | ICD-10-CM | POA: Diagnosis not present

## 2015-11-06 DIAGNOSIS — N946 Dysmenorrhea, unspecified: Secondary | ICD-10-CM

## 2015-11-06 DIAGNOSIS — R1310 Dysphagia, unspecified: Secondary | ICD-10-CM

## 2015-11-06 NOTE — Progress Notes (Signed)
Subjective  24 y.o. G26P0 Caucasian female here for pelvic ultrasound for potential ovarian cysts.  Left sided pelvic pain for months.   Last menstrual cycle not so painful. Pain during menstruation wakes patient up at night.  Using heating pad and ibuprofen 400 mg by mouth every 5 hours which help sometimes.  First 3 days of cycle, nothing really works to help pain.  Having blood with bowel movements during her cycle.  Had a tampon in at that time so knows it was coming from the rectum.  No hematuria.   Saw GI for right sided pain and had CT showing possible focal nodular hyperplasia or adenoma.  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 Patient stopped her birth control pills due to this.  Told she may have IBS.  Hx of painful intercourse.   Concerned about potential exposure to herpes.  Had some lumps on her vulva.  Had a negative HSV culture and negative HSV testing.  Negative MRSA testing.  No hx or oral HSV.  Wants retesting when returns for follow up visit.  Objective  Pelvic ultrasound images and report reviewed with patient.  Uterus - normal. EMS - 1.05 mm. Ovaries - right ovary with 10 mm collapsed hemorrhagic CL cyst.  Left ovary normal. Free fluid - no       Assessment   Dysmenorrhea.  Dyspareunia. Rectal bleeding with menses.  I really suspect patient has endometriosis.  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 Desire for repeat testing for HSV.   Plan  Refer back to GI, Dr. Erlene Quan, for colonoscopy during menses.  I will ask triage to help make this referral as I cannot pull up Dr. Erlene Quan name in [facility]. Will need to confer with GI about a potential plan for hormonal treatment of dysmenorrhea.  I am favoring a Skyla IUD or Nexplanon. I have also discussed the option of laparoscopic surgical care for evaluation and treatment of pain and potential endometriosis. Depo Lupron for 6 months is also an option.  Will do repeat serum  testing for HSV at future visit as a recheck.   ___25___ minutes face to face time of which over 50% was spent in counseling.   After visit summary to patient.   At the end of the consultation, patient also asking for evaluation of difficulty swallowing.  She noticed this after having class about neck anatomy.  She wants her glands checked.  Neck palpated by me - no adenopathy or thyromegaly.  I recommended that she see her PCP if her symptoms persist. If the dysphagia continues may need to see ENT.

## 2015-11-06 NOTE — Patient Instructions (Signed)
Endometriosis Endometriosis is a condition in which the tissue that lines the uterus (endometrium) grows outside of its normal location. The tissue may grow in many locations close to the uterus, but it commonly grows on the ovaries, fallopian tubes, vagina, or bowel. Because the uterus expels, or sheds, its lining every menstrual cycle, there is bleeding wherever the endometrial tissue is located. This can cause pain because blood is irritating to tissues not normally exposed to it.  CAUSES  The cause of endometriosis is not known.  SIGNS AND SYMPTOMS  Often, there are no symptoms. When symptoms are present, they can vary with the location of the displaced tissue. Various symptoms can occur at different times. Although symptoms occur mainly during a woman's menstrual period, they can also occur midcycle and usually stop with menopause. Some people may go months with no symptoms at all. Symptoms may include:   Back or abdominal pain.   Heavier bleeding during periods.   Pain during intercourse.   Painful bowel movements.   Infertility. DIAGNOSIS  Your health care provider will do a physical exam and ask about your symptoms. Various tests may be done, such as:   Blood tests and urine tests. These are done to help rule out other problems.   Ultrasound. This test is done to look for abnormal tissue.   An X-ray of the lower bowel (barium enema).  Laparoscopy. In this procedure, a thin, lighted tube with a tiny camera on the end (laparoscope) is inserted into your abdomen. This helps your health care provider look for abnormal tissue to confirm the diagnosis. The health care provider may also remove a small piece of tissue (biopsy) from any abnormal tissue found. This tissue sample can then be sent to a lab so it can be looked at under a microscope. TREATMENT  Treatment will vary and may include:   Medicines to relieve pain. Nonsteroidal anti-inflammatory drugs (NSAIDs) are a type of  pain medicine that can help to relieve the pain caused by endometriosis.  Hormonal therapy. When using hormonal therapy, periods are eliminated. This eliminates the monthly exposure to blood by the displaced endometrial tissue.   Surgery. Surgery may sometimes be done to remove the abnormal endometrial tissue. In severe cases, surgery may be done to remove the fallopian tubes, uterus, and ovaries (hysterectomy). HOME CARE INSTRUCTIONS   Take all medicines as directed by your health care provider. Do not take aspirin because it may increase bleeding when you are not on hormonal therapy.   Avoid activities that produce pain, including sexual activity. SEEK MEDICAL CARE IF:  You have pelvic pain before, after, or during your periods.  You have pelvic pain between periods that gets worse during your period.  You have pelvic pain during or after sex.  You have pelvic pain with bowel movements or urination, especially during your period.  You have problems getting pregnant.  You have a fever. SEEK IMMEDIATE MEDICAL CARE IF:   Your pain is severe and is not responding to pain medicine.   You have severe nausea and vomiting, or you cannot keep foods down.   You have pain that is limited to the right lower part of your abdomen.   You have swelling or increasing pain in your abdomen.   You see blood in your stool.  MAKE SURE YOU:   Understand these instructions.  Will watch your condition.  Will get help right away if you are not doing well or get worse.   This information   is not intended to replace advice given to you by your health care provider. Make sure you discuss any questions you have with your health care provider.   Document Released: 11/12/2000 Document Revised: 12/06/2014 Document Reviewed: 07/13/2013 Elsevier Interactive Patient Education 2016 Elsevier Inc.  

## 2015-11-10 ENCOUNTER — Telehealth: Payer: Self-pay | Admitting: Emergency Medicine

## 2015-11-10 NOTE — Telephone Encounter (Signed)
Call to patient and left message to call back.   She is scheduled for consult with Dr. Erlene Quan on 11/21/15 at 0830 at [facility]. [place].  Office visit notes faxed.

## 2015-11-10 NOTE — Telephone Encounter (Signed)
-----   Message from Nunzio Cobbs, MD sent at 11/07/2015  5:41 AM EST ----- Regarding: Please help make GI referral Please help me make a GI referral to Dr. Maura Crandall, her GI,  for colonoscopy for the following: 1.   rectal bleeding during her menses.  I would like the colonoscopy to be done during her menses.  2.  Discussion of potential progesterone hormonal contraception for treatment of dysmenorrhea and suspected endometriosis.   She has a history of a liver adenoma and we need a treatment solution for her pain.  He told her to stop her combined oral contraceptives.  Thanks,   [place]

## 2015-11-10 NOTE — Telephone Encounter (Signed)
Sierra Mccullough returned call and she is given appointment information and agreeable to consult appointment as scheduled.  Sierra Mccullough to call Dr. Eugene Gavia office directly if needs to change appointment.  Routing to provider for final review. Sierra Mccullough agreeable to disposition. Will close encounter.

## 2015-12-23 ENCOUNTER — Telehealth: Payer: Self-pay | Admitting: Obstetrics and Gynecology

## 2015-12-23 ENCOUNTER — Ambulatory Visit (HOSPITAL_COMMUNITY): Payer: BLUE CROSS/BLUE SHIELD | Admitting: Certified Registered Nurse Anesthetist

## 2015-12-23 ENCOUNTER — Ambulatory Visit (INDEPENDENT_AMBULATORY_CARE_PROVIDER_SITE_OTHER): Payer: BLUE CROSS/BLUE SHIELD | Admitting: Obstetrics & Gynecology

## 2015-12-23 ENCOUNTER — Ambulatory Visit (HOSPITAL_COMMUNITY)
Admission: AD | Admit: 2015-12-23 | Discharge: 2015-12-23 | Disposition: A | Discharge status: Home / Self Care | Payer: BLUE CROSS/BLUE SHIELD | Source: Ambulatory Visit | Attending: Obstetrics and Gynecology | Admitting: Obstetrics and Gynecology

## 2015-12-23 ENCOUNTER — Encounter: Payer: Self-pay | Admitting: Obstetrics & Gynecology

## 2015-12-23 ENCOUNTER — Other Ambulatory Visit: Payer: Self-pay | Admitting: *Deleted

## 2015-12-23 ENCOUNTER — Encounter (HOSPITAL_COMMUNITY)
Admission: AD | Disposition: A | Discharge status: Home / Self Care | Payer: Self-pay | Source: Ambulatory Visit | Attending: Obstetrics and Gynecology

## 2015-12-23 ENCOUNTER — Encounter (HOSPITAL_COMMUNITY): Payer: Self-pay | Admitting: *Deleted

## 2015-12-23 VITALS — BP 110/62 | HR 80 | Temp 98.3°F | Resp 16 | Ht 64.0 in | Wt 145.0 lb

## 2015-12-23 DIAGNOSIS — N83209 Unspecified ovarian cyst, unspecified side: Secondary | ICD-10-CM | POA: Diagnosis not present

## 2015-12-23 DIAGNOSIS — R102 Pelvic and perineal pain: Secondary | ICD-10-CM | POA: Diagnosis not present

## 2015-12-23 DIAGNOSIS — N946 Dysmenorrhea, unspecified: Secondary | ICD-10-CM | POA: Insufficient documentation

## 2015-12-23 DIAGNOSIS — N803 Endometriosis of pelvic peritoneum: Secondary | ICD-10-CM | POA: Insufficient documentation

## 2015-12-23 DIAGNOSIS — N83201 Unspecified ovarian cyst, right side: Secondary | ICD-10-CM | POA: Diagnosis not present

## 2015-12-23 DIAGNOSIS — N801 Endometriosis of ovary: Secondary | ICD-10-CM | POA: Diagnosis not present

## 2015-12-23 DIAGNOSIS — N83202 Unspecified ovarian cyst, left side: Secondary | ICD-10-CM

## 2015-12-23 HISTORY — PX: WART FULGURATION: SHX5245

## 2015-12-23 HISTORY — PX: LAPAROSCOPIC OVARIAN CYSTECTOMY: SHX6248

## 2015-12-23 HISTORY — PX: CYSTOSCOPY: SHX5120

## 2015-12-23 LAB — CBC
HCT: 36.4 % (ref 36.0–46.0)
Hemoglobin: 11.6 g/dL — ABNORMAL LOW (ref 12.0–15.0)
MCH: 26.3 pg (ref 26.0–34.0)
MCHC: 31.9 g/dL (ref 30.0–36.0)
MCV: 82.5 fL (ref 78.0–100.0)
Platelets: 250 10*3/uL (ref 150–400)
RBC: 4.41 MIL/uL (ref 3.87–5.11)
RDW: 13.7 % (ref 11.5–15.5)
WBC: 9.2 10*3/uL (ref 4.0–10.5)

## 2015-12-23 SURGERY — EXCISION, CYST, OVARY, LAPAROSCOPIC
Anesthesia: General | Site: Urethra | Laterality: Right

## 2015-12-23 MED ORDER — LIDOCAINE HCL (CARDIAC) 20 MG/ML IV SOLN
INTRAVENOUS | Status: DC | PRN
Start: 2015-12-23 — End: 2015-12-23
  Administered 2015-12-23: 100 mg via INTRAVENOUS

## 2015-12-23 MED ORDER — IBUPROFEN 800 MG PO TABS: 800.0000 mg | ORAL_TABLET | Freq: Three times a day (TID) | ORAL | Status: DC | PRN

## 2015-12-23 MED ORDER — HYDROMORPHONE HCL 1 MG/ML IJ SOLN
INTRAMUSCULAR | Status: DC | PRN
  Administered 2015-12-23 (×2): 1 mg via INTRAVENOUS

## 2015-12-23 MED ORDER — OXYCODONE-ACETAMINOPHEN 5-325 MG PO TABS
1.0000 | ORAL_TABLET | ORAL | Status: DC | PRN
  Administered 2015-12-23: 1 via ORAL

## 2015-12-23 MED ORDER — DEXAMETHASONE SODIUM PHOSPHATE 10 MG/ML IJ SOLN
INTRAMUSCULAR | Status: DC | PRN
  Administered 2015-12-23: 4 mg via INTRAVENOUS

## 2015-12-23 MED ORDER — PHENYLEPHRINE 40 MCG/ML (10ML) SYRINGE FOR IV PUSH (FOR BLOOD PRESSURE SUPPORT)
PREFILLED_SYRINGE | INTRAVENOUS | Status: AC
  Filled 2015-12-23: qty 10

## 2015-12-23 MED ORDER — PROPOFOL 10 MG/ML IV BOLUS
INTRAVENOUS | Status: AC
  Filled 2015-12-23: qty 20

## 2015-12-23 MED ORDER — ONDANSETRON HCL 4 MG/2ML IJ SOLN
INTRAMUSCULAR | Status: AC
  Filled 2015-12-23: qty 2

## 2015-12-23 MED ORDER — ROCURONIUM BROMIDE 100 MG/10ML IV SOLN
INTRAVENOUS | Status: AC
  Filled 2015-12-23: qty 1

## 2015-12-23 MED ORDER — ACETAMINOPHEN 160 MG/5ML PO SOLN
ORAL | Status: AC
  Filled 2015-12-23: qty 40.6

## 2015-12-23 MED ORDER — LACTATED RINGERS IR SOLN
Status: DC | PRN
  Administered 2015-12-23 (×2): 3000 mL

## 2015-12-23 MED ORDER — EPHEDRINE 5 MG/ML INJ
INTRAVENOUS | Status: AC
  Filled 2015-12-23: qty 10

## 2015-12-23 MED ORDER — MIDAZOLAM HCL 2 MG/2ML IJ SOLN
INTRAMUSCULAR | Status: DC | PRN
  Administered 2015-12-23: 2 mg via INTRAVENOUS

## 2015-12-23 MED ORDER — HYDROMORPHONE HCL 1 MG/ML IJ SOLN
0.2500 mg | INTRAMUSCULAR | Status: DC | PRN
  Administered 2015-12-23 (×3): 0.25 mg via INTRAVENOUS

## 2015-12-23 MED ORDER — DEXAMETHASONE SODIUM PHOSPHATE 4 MG/ML IJ SOLN
INTRAMUSCULAR | Status: AC
  Filled 2015-12-23: qty 1

## 2015-12-23 MED ORDER — GLYCOPYRROLATE 0.2 MG/ML IJ SOLN
INTRAMUSCULAR | Status: AC
  Filled 2015-12-23: qty 2

## 2015-12-23 MED ORDER — PROPOFOL 10 MG/ML IV BOLUS
INTRAVENOUS | Status: DC | PRN
  Administered 2015-12-23: 180 mg via INTRAVENOUS

## 2015-12-23 MED ORDER — SCOPOLAMINE 1 MG/3DAYS TD PT72
MEDICATED_PATCH | TRANSDERMAL | Status: AC
  Administered 2015-12-23: 1.5 mg via TRANSDERMAL
  Filled 2015-12-23: qty 1

## 2015-12-23 MED ORDER — HYDROMORPHONE HCL 1 MG/ML IJ SOLN
INTRAMUSCULAR | Status: AC
  Administered 2015-12-23: 0.25 mg via INTRAVENOUS
  Filled 2015-12-23: qty 1

## 2015-12-23 MED ORDER — NEOSTIGMINE METHYLSULFATE 10 MG/10ML IV SOLN
INTRAVENOUS | Status: DC | PRN
  Administered 2015-12-23: 2.5 mg via INTRAVENOUS

## 2015-12-23 MED ORDER — HYDROMORPHONE HCL 1 MG/ML IJ SOLN
INTRAMUSCULAR | Status: AC
  Filled 2015-12-23: qty 1

## 2015-12-23 MED ORDER — LIDOCAINE HCL (CARDIAC) 20 MG/ML IV SOLN
INTRAVENOUS | Status: AC
  Filled 2015-12-23: qty 5

## 2015-12-23 MED ORDER — EPHEDRINE SULFATE 50 MG/ML IJ SOLN
INTRAMUSCULAR | Status: DC | PRN
  Administered 2015-12-23: 10 mg via INTRAVENOUS

## 2015-12-23 MED ORDER — CLINDAMYCIN PHOSPHATE 900 MG/50ML IV SOLN
900.0000 mg | Freq: Once | INTRAVENOUS | Status: AC
  Administered 2015-12-23: 900 mg via INTRAVENOUS
  Filled 2015-12-23: qty 50

## 2015-12-23 MED ORDER — GLYCOPYRROLATE 0.2 MG/ML IJ SOLN
INTRAMUSCULAR | Status: DC | PRN
  Administered 2015-12-23: .5 mg via INTRAVENOUS

## 2015-12-23 MED ORDER — ONDANSETRON HCL 4 MG/2ML IJ SOLN
INTRAMUSCULAR | Status: DC | PRN
  Administered 2015-12-23: 4 mg via INTRAVENOUS

## 2015-12-23 MED ORDER — LACTATED RINGERS IV SOLN
INTRAVENOUS | Status: DC
  Administered 2015-12-23 (×3): via INTRAVENOUS

## 2015-12-23 MED ORDER — PHENYLEPHRINE HCL 10 MG/ML IJ SOLN
INTRAMUSCULAR | Status: DC | PRN
  Administered 2015-12-23 (×2): 80 ug via INTRAVENOUS
  Administered 2015-12-23: 40 ug via INTRAVENOUS

## 2015-12-23 MED ORDER — MIDAZOLAM HCL 2 MG/2ML IJ SOLN
INTRAMUSCULAR | Status: AC
  Filled 2015-12-23: qty 2

## 2015-12-23 MED ORDER — HEPARIN SODIUM (PORCINE) 5000 UNIT/ML IJ SOLN
INTRAMUSCULAR | Status: AC
  Filled 2015-12-23: qty 1

## 2015-12-23 MED ORDER — LACTATED RINGERS IV SOLN
INTRAVENOUS | Status: DC
  Administered 2015-12-23: 14:00:00 via INTRAVENOUS
  Administered 2015-12-23: 125 mL/h via INTRAVENOUS

## 2015-12-23 MED ORDER — FENTANYL CITRATE (PF) 250 MCG/5ML IJ SOLN
INTRAMUSCULAR | Status: DC | PRN
  Administered 2015-12-23: 100 ug via INTRAVENOUS
  Administered 2015-12-23 (×3): 50 ug via INTRAVENOUS

## 2015-12-23 MED ORDER — SCOPOLAMINE 1 MG/3DAYS TD PT72
1.0000 | MEDICATED_PATCH | Freq: Once | TRANSDERMAL | Status: DC
  Administered 2015-12-23: 1.5 mg via TRANSDERMAL

## 2015-12-23 MED ORDER — ACETAMINOPHEN 160 MG/5ML PO SOLN
950.0000 mg | Freq: Once | ORAL | Status: AC
  Administered 2015-12-23: 950 mg via ORAL

## 2015-12-23 MED ORDER — OXYCODONE-ACETAMINOPHEN 5-325 MG PO TABS
ORAL_TABLET | ORAL | Status: AC
  Administered 2015-12-23: 1 via ORAL
  Filled 2015-12-23: qty 1

## 2015-12-23 MED ORDER — FENTANYL CITRATE (PF) 250 MCG/5ML IJ SOLN
INTRAMUSCULAR | Status: AC
  Filled 2015-12-23: qty 5

## 2015-12-23 MED ORDER — KETOROLAC TROMETHAMINE 30 MG/ML IJ SOLN
INTRAMUSCULAR | Status: AC
  Filled 2015-12-23: qty 1

## 2015-12-23 MED ORDER — KETOROLAC TROMETHAMINE 30 MG/ML IJ SOLN
INTRAMUSCULAR | Status: DC | PRN
  Administered 2015-12-23: 30 mg via INTRAVENOUS

## 2015-12-23 MED ORDER — BUPIVACAINE HCL (PF) 0.25 % IJ SOLN
INTRAMUSCULAR | Status: AC
  Filled 2015-12-23: qty 30

## 2015-12-23 MED ORDER — ROCURONIUM BROMIDE 100 MG/10ML IV SOLN
INTRAVENOUS | Status: DC | PRN
  Administered 2015-12-23: 30 mg via INTRAVENOUS

## 2015-12-23 MED ORDER — BUPIVACAINE HCL (PF) 0.25 % IJ SOLN
INTRAMUSCULAR | Status: DC | PRN
  Administered 2015-12-23: 3 mL
  Administered 2015-12-23: 6 mL

## 2015-12-23 MED ORDER — NEOSTIGMINE METHYLSULFATE 10 MG/10ML IV SOLN
INTRAVENOUS | Status: AC
  Filled 2015-12-23: qty 1

## 2015-12-23 SURGICAL SUPPLY — 33 items
APL SKNCLS STERI-STRIP NONHPOA (GAUZE/BANDAGES/DRESSINGS)
BAG SPEC RTRVL LRG 6X4 10 (ENDOMECHANICALS)
BARRIER ADHS 3X4 INTERCEED (GAUZE/BANDAGES/DRESSINGS) IMPLANT
BENZOIN TINCTURE PRP APPL 2/3 (GAUZE/BANDAGES/DRESSINGS) IMPLANT
BRR ADH 4X3 ABS CNTRL BYND (GAUZE/BANDAGES/DRESSINGS)
CABLE HIGH FREQUENCY MONO STRZ (ELECTRODE) ×4 IMPLANT
CANISTER SUCT 3000ML (MISCELLANEOUS) ×4 IMPLANT
CLOTH BEACON ORANGE TIMEOUT ST (SAFETY) ×4 IMPLANT
DRSG COVADERM PLUS 2X2 (GAUZE/BANDAGES/DRESSINGS) ×8 IMPLANT
DRSG OPSITE POSTOP 3X4 (GAUZE/BANDAGES/DRESSINGS) IMPLANT
FILTER SMOKE EVAC LAPAROSHD (FILTER) ×4 IMPLANT
FORCEPS CUTTING 33CM 5MM (CUTTING FORCEPS) ×4 IMPLANT
GLOVE BIO SURGEON STRL SZ 6.5 (GLOVE) ×8 IMPLANT
GLOVE BIOGEL PI IND STRL 7.0 (GLOVE) ×3 IMPLANT
GLOVE BIOGEL PI INDICATOR 7.0 (GLOVE) ×1
GOWN STRL REUS W/TWL LRG LVL3 (GOWN DISPOSABLE) ×8 IMPLANT
LIQUID BAND (GAUZE/BANDAGES/DRESSINGS) IMPLANT
NS IRRIG 1000ML POUR BTL (IV SOLUTION) IMPLANT
PACK LAPAROSCOPY BASIN (CUSTOM PROCEDURE TRAY) ×4 IMPLANT
PAD TRENDELENBURG POSITION (MISCELLANEOUS) ×4 IMPLANT
POUCH SPECIMEN RETRIEVAL 10MM (ENDOMECHANICALS) IMPLANT
SCISSORS LAP 5X35 DISP (ENDOMECHANICALS) ×4 IMPLANT
SET IRRIG TUBING LAPAROSCOPIC (IRRIGATION / IRRIGATOR) ×4 IMPLANT
SOLUTION ELECTROLUBE (MISCELLANEOUS) IMPLANT
STRIP CLOSURE SKIN 1/4X3 (GAUZE/BANDAGES/DRESSINGS) IMPLANT
SUT PLAIN 3 0 FS 2 27 (SUTURE) ×4 IMPLANT
SUT VIC AB 4-0 PS2 27 (SUTURE) ×4 IMPLANT
SUT VICRYL 0 UR6 27IN ABS (SUTURE) ×4 IMPLANT
TOWEL OR 17X24 6PK STRL BLUE (TOWEL DISPOSABLE) ×8 IMPLANT
TRAY FOLEY CATH SILVER 14FR (SET/KITS/TRAYS/PACK) ×4 IMPLANT
TROCAR XCEL DIL TIP R 11M (ENDOMECHANICALS) IMPLANT
WARMER LAPAROSCOPE (MISCELLANEOUS) ×4 IMPLANT
WATER STERILE IRR 1000ML POUR (IV SOLUTION) IMPLANT

## 2015-12-23 NOTE — Telephone Encounter (Signed)
Patient's mother [name] calling she says patient came home from her clinicals yesterday doubled over in pain. She was unable to urinate so they thought it was a uti. Patient was seen in the ER this morning and was told that she has a enlarged right ovary with a cystic like structure. Patient was prescribed pain medication. Patient's mother says she is in pain and she doesn't know what she should do for the patient.  Best # to reach: [phone] (No Chart)

## 2015-12-23 NOTE — H&P (Signed)
GYNECOLOGY  VISIT   HPI: 25 y.o.   Single  Caucasian  female   G0P0 with Patient's last menstrual period was 12/21/2015.   here for  Sudden onset lower abdominal pain starting yesterday. Dr. Sabra Heck has seen the patient already today and informed me of my patient's status.  I then presented to see and evaluate the patient as well.   Patient's mother is present for all discussion today.   Pain started suprapubic with radiation to the umbilicus and now just hurts across the entire lower abdomen.  Voiding and movement increases the pain.  Went to [facility] ED and diagnosed with an enlarged right ovary 5.6 cm with a 2.2 cm cystic structure within it. Stranding within the ovary noted.  Diagnosis is hemorrhagic ovarian cyst.  Normal doppler flow.  Normal left ovary and uterus.  Had a negative UPT. Patient received morphine 4 mg twice and then Percocet 5 mg/325 mg, 2 tabs, at 5:00 am prior to discharge to home.  Took an additional Percocet 5 mg/325 mg at 10:30 am. Still having pain.  Some nausea and no vomiting.  Having bowel movements, twice yesterday.  Passing flatus. States a low grade temp to about 99 degrees F and having congestion.   Patient has a history of dysmenorrhea, dyspareunia, and rectal bleeding with menses. Pelvic ultrasound in office in December 2016: Uterus - normal. EMS - 1.05 mm. Ovaries - right ovary with 10 mm collapsed hemorrhagic CL cyst. Left ovary normal. Free fluid - no  Normal sigmoidoscopy 11/28/15.  No sign of endometriosis.   Stopped combined oral contraceptives due to a liver adenoma.   GYNECOLOGIC HISTORY: Patient's last menstrual period was 12/21/2015. Contraception: not sexually active currently. Last intercourse was last year. Last pap smear:  06/06/15 - WNL.         OB History    Gravida Para Term Preterm AB TAB SAB Ectopic Multiple Living   0 0                 Patient Active Problem List   Diagnosis Date Noted  . Acute pharyngitis  06/25/2015  . Abnormal EKG 05/05/2015  . Palpitation 05/05/2015  . Right cervical radiculopathy 09/09/2014  . History of nephrolithiasis 11/10/2011  . Menorrhagia 11/10/2011    Past Medical History  Diagnosis Date  . Nephrolithiasis   . Dyspareunia   . Migraines   . Liver mass     Past Surgical History  Procedure Laterality Date  . Urethral stent  2011    due to kidney stones    Current Facility-Administered Medications  Medication Dose Route Frequency Provider Last Rate Last Dose  . acetaminophen (TYLENOL) solution 950 mg  950 mg Oral Once Lyndle Herrlich, MD      . clindamycin (CLEOCIN) IVPB 900 mg  900 mg Intravenous Once Nunzio Cobbs, MD      . Derrill Memo ON 12/24/2015] lactated ringers infusion   Intravenous Continuous Lyndle Herrlich, MD      . lactated ringers infusion   Intravenous Continuous Nunzio Cobbs, MD      . scopolamine (TRANSDERM-SCOP) 1 MG/3DAYS 1.5 mg  1 patch Transdermal Once Lyndle Herrlich, MD      . scopolamine (TRANSDERM-SCOP) 1 MG/3DAYS              ALLERGIES: Keflex and Sulfonamide derivatives  Family History  Problem Relation Age of Onset  . Hypertension Mother   . Diabetes Mother   . Migraines Mother   .  Diabetes Maternal Grandfather   . Heart disease Paternal Grandfather     Heart valved disease, died of MI age 57  . Heart disease Father     "ventricular tachycardia"  treated with beta blockers.   . Heart disease Paternal Uncle 68    ICD, MI  . Heart disease Paternal Aunt     Needs valve replacment CHF    Social History   Social History  . Marital Status: Single    Spouse Name: N/A  . Number of Children: N/A  . Years of Education: N/A   Occupational History  . Not on file.   Social History Main Topics  . Smoking status: Never Smoker   . Smokeless tobacco: Never Used  . Alcohol Use: No  . Drug Use: No  . Sexual Activity:    Partners: Male    Birth Control/ Protection: Abstinence   Other Topics Concern  . Not  on file   Social History Narrative   Born in [place].  Mother is Writer and father is in Architect.  Lives with mother [name] and sister [name].   Resp therapy student.    ROS:  Pertinent items are noted in HPI.  PHYSICAL EXAMINATION:    LMP 12/21/2015    General appearance: cooperative and appears stated age.  Looks tired. Head: Normocephalic, without obvious abnormality, atraumatic Lungs: clear to auscultation bilaterally Heart: regular rate and rhythm Abdomen: soft, non-tender; bowel sounds normal; no masses,  no organomegaly Extremities: extremities normal, atraumatic, no cyanosis or edema Skin: Skin color, texture, turgor normal. No rashes or lesions Lymph nodes: Cervical, supraclavicular, and axillary nodes normal. No abnormal inguinal nodes palpated Neurologic: Grossly normal  Pelvic: External genitalia:  no lesions              Urethra:  normal appearing urethra with no masses, tenderness or lesions              Bartholins and Skenes: normal                 Vagina: normal appearing vagina with normal color and discharge, no lesions              Cervix: no lesions and menstrual blood noted.              Bimanual Exam:  Uterus:  normal size, contour, position, consistency, mobility, non-tender              Adnexa: right adnexal mass, tender and feels somewhat fixed.  No left adnexal mass or tenderness.               Chaperone was present for exam.  ASSESSMENT  Right ovarian cyst.  Probable hemorrhagia ovarian cyst.  Hx of dysmenorrhea.  Normal recent colonoscopy to rule out endometriosis. Hx liver adenoma.   PLAN  Counseled regarding right ovarian cyst and dysmenorrhea. Discussed options of observational management with pain medication, estrogen free contraception such as Depo Provera or Mirena IUD, Depo Lupron, and surgical evaluation and treatment with laparoscopic right ovarian cystectomy and treatment of possible endometriosis.  Risks and  benefits of surgery discussed.  Risks may include but are not limited to bleeding, infection, damage to surrounding organs, reaction to anesthesia, death, DVT, PE, recurrent ovarian cysts/endometriosis, development of adhesive disease related to surgery.    An After Visit Summary was printed and given to the patient.  __25____ minutes face to face time of which over 50% was spent in counseling.

## 2015-12-23 NOTE — Anesthesia Procedure Notes (Signed)
Procedure Name: Intubation Date/Time: 12/23/2015 2:43 PM Performed by: Flossie Dibble Pre-anesthesia Checklist: Patient being monitored, Suction available, Emergency Drugs available, Patient identified and Timeout performed Patient Re-evaluated:Patient Re-evaluated prior to inductionOxygen Delivery Method: Circle system utilized Preoxygenation: Pre-oxygenation with 100% oxygen Intubation Type: IV induction Ventilation: Mask ventilation without difficulty and Oral airway inserted - appropriate to patient size Laryngoscope Size: Mac and 3 Grade View: Grade I Tube type: Oral Tube size: 7.0 mm Number of attempts: 1 Airway Equipment and Method: Stylet Placement Confirmation: ETT inserted through vocal cords under direct vision,  positive ETCO2 and breath sounds checked- equal and bilateral Secured at: 22 cm Tube secured with: Tape Dental Injury: Teeth and Oropharynx as per pre-operative assessment

## 2015-12-23 NOTE — Anesthesia Postprocedure Evaluation (Signed)
Anesthesia Post Note  Patient: Sierra Mccullough  Procedure(s) Performed: Procedure(s) (LRB): LAPAROSCOPIC OVARIAN CYSTECTOMY WITH PERITONEAL BIOPSY (Right) FULGUATION ENDOMETRIOSIS (N/A) CYSTOSCOPY  Patient location during evaluation: PACU Anesthesia Type: General Level of consciousness: awake Pain management: pain level controlled Vital Signs Assessment: post-procedure vital signs reviewed and stable Respiratory status: spontaneous breathing Cardiovascular status: stable Postop Assessment: no signs of nausea or vomiting Anesthetic complications: no    Last Vitals:  Filed Vitals:   12/23/15 1815 12/23/15 1920  BP: 109/58 125/70  Pulse: 95 93  Temp: 36.9 C 36.9 C  Resp: 18 16    Last Pain:  Filed Vitals:   12/23/15 1939  PainSc: [place]

## 2015-12-23 NOTE — Telephone Encounter (Signed)
[name] with patient directly. She went to emergency room at [facility] last night with feelings of dysuria. Ultrasound results as below from visit reviewed in [place].   She was given pain medications in Emergency room Morphine and oxycodone. Last medication today at 0500 patient states she was given two oxycodone this morning when leaving Emergency Department. Patient states pain right now is 7/10. She states pain is worse with movement and feels pressure with voiding. Currently on her cycle now, no heavy bleeding.   Discussed with Dr. Sabra Heck. Offer office visit today for evaluation and Dr. Sabra Heck will discuss patient with Dr. Quincy Simmonds who is in [place] today.   Call to patient and office visit offered today at 1100 with Dr. Sabra Heck. Patient agreeable. Advised surgical plans may be made, not to eat or drink anything at all before being seen in our office.  Patient verbalized understanding. Her mother is driving her to the office.   Routing to provider for final review. Patient agreeable to disposition. Will close encounter.    Cc Dr. Quincy Simmonds.

## 2015-12-23 NOTE — Transfer of Care (Signed)
Immediate Anesthesia Transfer of Care Note  Patient: Sierra Mccullough  Procedure(s) Performed: Procedure(s): LAPAROSCOPIC OVARIAN CYSTECTOMY WITH PERITONEAL BIOPSY (Right) FULGUATION ENDOMETRIOSIS (N/A) CYSTOSCOPY  Patient Location: PACU  Anesthesia Type:General  Level of Consciousness: awake, alert  and oriented  Airway & Oxygen Therapy: Patient Spontanous Breathing and Patient connected to nasal cannula oxygen  Post-op Assessment: Report given to RN and Post -op Vital signs reviewed and stable  Post vital signs: Reviewed and stable  Last Vitals:  Filed Vitals:   12/23/15 1701  BP: 128/69  Pulse: 86  Temp: 37.2 C  Resp: 12    Complications: No apparent anesthesia complications

## 2015-12-23 NOTE — Anesthesia Preprocedure Evaluation (Signed)
Anesthesia Evaluation  Patient identified by MRN, date of birth, ID band Patient awake    Reviewed: Allergy & Precautions, H&P , Patient's Chart, lab work & pertinent test results, reviewed documented beta blocker date and time   Airway Mallampati: II  TM Distance: >3 FB Neck ROM: full    Dental no notable dental hx.    Pulmonary    Pulmonary exam normal breath sounds clear to auscultation       Cardiovascular  Rhythm:regular Rate:Normal     Neuro/Psych    GI/Hepatic   Endo/Other    Renal/GU      Musculoskeletal   Abdominal   Peds  Hematology   Anesthesia Other Findings   Reproductive/Obstetrics                             Anesthesia Physical Anesthesia Plan  ASA: II  Anesthesia Plan: General   Post-op Pain Management:    Induction: Intravenous  Airway Management Planned: Oral ETT  Additional Equipment:   Intra-op Plan:   Post-operative Plan: Extubation in OR  Informed Consent: I have reviewed the patients History and Physical, chart, labs and discussed the procedure including the risks, benefits and alternatives for the proposed anesthesia with the patient or authorized representative who has indicated his/her understanding and acceptance.   Dental Advisory Given and Dental advisory given  Plan Discussed with: CRNA and Surgeon  Anesthesia Plan Comments: (  Discussed general anesthesia, including possible nausea, instrumentation of airway, sore throat,pulmonary aspiration, etc. I asked if the were any outstanding questions, or  concerns before we proceeded.)        Anesthesia Quick Evaluation  

## 2015-12-23 NOTE — Op Note (Signed)
NAME:  Sierra Mccullough, OVERMILLER NO.:  [id number]  MEDICAL RECORD NO.:  [id number]  LOCATION:  [facility]                          FACILITY:  [facility]  PHYSICIAN:  Lenard Galloway, M.D.   DATE OF BIRTH:  1991/01/23  DATE OF PROCEDURE:  12/23/2015 DATE OF DISCHARGE:  12/23/2015                              OPERATIVE REPORT   PREOPERATIVE DIAGNOSIS:  Right ovarian cyst, pelvic pain, dysmenorrhea.  POSTOPERATIVE DIAGNOSIS:  Right ovarian cyst, pelvic pain, dysmenorrhea, mild-to-moderate endometriosis.  SURGEON:  Lenard Galloway, MD  ASSISTANT:  Sumner Boast, MD  PROCEDURE:  Laparoscopic right ovarian cystectomy, peritoneal biopsy, fulguration of endometriosis, cystoscopy.  ANESTHESIA:  General endotracheal, local with 0.25% Marcaine.  IV FLUIDS:  2800 mL Ringer's lactate.  ESTIMATED BLOOD LOSS:  25 mL.  URINE OUTPUT:  100 mL.  COMPLICATIONS:  None.  INDICATIONS FOR THE PROCEDURE:  The patient is a 25 year old gravida 0, para 48 Caucasian female, who presented with sudden onset of lower abdominal pain beginning on December 22, 2015.  The patient presented to [facility] for emergency care at which time, she was diagnosed with an enlarged right ovary measuring 5 - 6 cm with evidence of a hemorrhagic cyst and no evidence of a torsion.  The patient received narcotic pain medication and was discharged to home.  She had follow up in the office on December 23, 2015, at which time, the patient had significant pelvic pain.  She had a previous history of dysmenorrhea with suspected possible endometriosis based on her prior clinical history.  A plan is now made to proceed with a laparoscopy with a right ovarian cystectomy and possible treatment of endometriosis.  Risks, benefits, and alternatives are reviewed with the patient who wishes to proceed.  FINDINGS:  Examination under anesthesia revealed a 5 cm right adnexal mass.  The uterus was normal and the left adnexal region palpated to  be normal.  At the time of laparoscopy, the patient was noted to have a 5-6 cm right ovarian cyst which was thin walled and filled with serous light yellow fluid.  Over a portion of the ovary on the patient's right-hand side, she had evidence of a fibrin and blood which were thought to be possibly consistent with endometriosis on the surface of the ovary.  There was no evidence of an endometrioma.  The bilateral fallopian tubes, left ovary, and uterus were normal.  In the anterior cul-de-sac, the patient had a 4 cm area which had salt and peppered tannish brown lesions which were thought to be consistent with endometriosis.  There was a similar brownish 3 mm lesion in the left posterior cul-de-sac.  The right pelvic sidewall also contained of some scarring directly over the pathway of the ureter and this was very dense.  There were some similar scarred lesions of endometriosis which were lateral to the right uterosacral ligament but medial to the ureter.  The left pelvic sidewall was free of endometriosis.  The appendix was unremarkable.  There was a loop of small bowel which looked like it was congenitally adherent to the pelvic brim on the patient's right-hand side, this was not dissected out.  In the upper abdomen, the  liver and gallbladder appeared to be unremarkable.  There were no lesions of endometriosis appreciated on the surface of the bowel.  Cystoscopy at termination of the procedure documented the bladder to be intact throughout 360 degrees including the bladder, dome, and trigone. There was no evidence of cystotomy.  There was no foreign body in the bladder or the urethra.  No bladder lesions were appreciated. The ureters were noted to be patent bilaterally.  SPECIMENS:  The right ovarian cyst and peritoneal biopsy were sent to pathology separately.   PROCEDURE IN DETAIL:  The patient was reidentified in the preoperative hold area.  She received clindamycin 900 mg IV  for antibiotic prophylaxis.  She received TED hose and PAS stockings for DVT prophylaxis.  In the operating room, the patient was placed in the dorsal lithotomy position with Allen stirrups.  Her arms were placed in the arm rests at her sides and were properly padded.  General endotracheal anesthesia was induced.  The patient's abdomen and vagina were then sterilely prepped and a Foley catheter was placed inside the bladder.  She was sterilely draped.  The speculum was placed inside the vagina and a single-tooth tenaculum was placed on the anterior cervical lip.  This was replaced with a Hulka tenaculum.  Attention was turned to the abdomen where a 1 cm vertical inferior umbilical incision was created with a scalpel.  Dissection down to the fascia was performed with an Allis clamp.  A 10-mm trocar was placed directly inside the peritoneal cavity.  There was an attempt made to use a Veress needle to enter inside the peritoneal cavity that when the saline drop test was performed, the fluid would not flow freely.  The Veress needle was noted to be obstructed.  The 10 mm trocar was placed inside the peritoneal cavity and the laparoscope did confirm proper placement.  A CO2 pneumoperitoneum was achieved and the patient was then placed in the Trendelenburg position.  A 5-mm right and left lower quadrant incisions were created with a scalpel after the skin was injected locally with 0.25% Marcaine.  5 mm trocars were placed under visualization of the laparoscope bilaterally.  An inspection of the pelvic and abdominal organs was performed and the findings were as noted above.  The ovarian cyst was grasped.  During the process of securing the cyst, the cyst did rupture and the fluid did escape from the cyst.  The suction irrigator was then used to remove the remaining fluid from within the cyst.  The monopolar scissors were used to sharply excise a portion of the ovarian cyst and the  overlying cortex as it was very thin.  This was set aside for pathologic analysis.  This was performed with a monopolar laparoscopic scissors.  The remainder of the cyst wall was then peeled away from the ovarian serosa and the cyst wall was removed from the peritoneal cavity and sent to pathology along with the remaining specimen.  At this point, there was some bleeding at the base of the ovarian cyst excision site, and this was coagulated with the Gyrus for good hemostasis.  At this time, attention was turned to the lesions of endometriosis which were fulgurated using the Gyrus instrument.  All lesions of endometriosis were treated with the exception of the lesion over the right ureter.    A 5 mm suprapubic trocar was placed after injecting the skin locally with marcaine and incising with a scalpel. The trocar was placed under direct vision of the laparoscope.  In the anterior cul-de-sac overlying the bladder, a biopsy was performed using a laparoscopic scissors.  This peritoneal biopsy was sent to Pathology separately. Hemostasis was excellent at the site of the peritoneal biopsy.  After all of the lesions of endometriosis were treated with the Gyrus instrument, attention was then turned to the dense lesion of endometriosis over the right ureter.  The peritoneum over the right pelvic sidewall was opened with the laparoscopic scissors.  The ureter was respected during this dissection.  An attempt was made to separate the peritoneum from the underlying ureter, but the lesion of endometriosis was very dense and there was concern about potential ureteral compromise if this procedure continued.  This lesion of endometriosis which measured approximately 5-7 mm was therefore not excised or removed. Hemostasis was good.  The Foley catheter was removed and cystoscopy was performed and the findings were as noted above.  The Foley catheter was not replaced in the bladder.  Final laparoscopy  demonstrated good hemostasis at all of the operative sites.  The CO2 pneumoperitoneum was partially released and there was no evidence of any ongoing bleeding.  Interceed was wrapped around the right tube and ovary.  A small piece of Interceed was placed over the biopsy site in the anterior cul-de-sac.  The laparoscopic ports were removed.  The CO2 pneumoperitoneum was released from the umbilical trocar.  Manual breaths were given to remove any remaining CO2 gas.  The umbilical trocar was then removed.  The fascia of the umbilical incision was closed with a single 0 Vicryl suture.  All skin incisions were closed with subcuticular sutures of 4-0 Vicryl.  The umbilical skin was injected with 0.25% Marcaine.  All of the incisions were covered with Dermabond, and a honeycomb dressing was placed over the umbilical incision.  The Hulka tenaculum was removed from the uterine cavity.  The patient was awakened and extubated, and escorted to the recovery room in stable condition.  There were no complications to the procedure. All needle, instrument, sponge counts were correct.     Lenard Galloway, M.D.    BES/MEDQ  D:  12/23/2015  T:  12/23/2015  Job:  [id number]

## 2015-12-23 NOTE — Discharge Instructions (Signed)
Diagnostic Laparoscopy A diagnostic laparoscopy is a procedure to diagnose diseases in the abdomen. During the procedure, a thin, lighted, pencil-sized instrument called a laparoscope is inserted into the abdomen through an incision. The laparoscope allows your health care provider to look at the organs inside your body. LET YOUR HEALTH CARE PROVIDER KNOW ABOUT:  Any allergies you have.  All medicines you are taking, including vitamins, herbs, eye drops, creams, and over-the-counter medicines.  Previous problems you or members of your family have had with the use of anesthetics.  Any blood disorders you have.  Previous surgeries you have had.  Medical conditions you have. RISKS AND COMPLICATIONS  Generally, this is a safe procedure. However, problems can occur, which may include:  Infection.  Bleeding.  Damage to other organs.  Allergic reaction to the anesthetics used during the procedure. BEFORE THE PROCEDURE  Do not eat or drink anything after midnight on the night before the procedure or as directed by your health care provider.  Ask your health care provider about:  Changing or stopping your regular medicines.  Taking medicines such as aspirin and ibuprofen. These medicines can thin your blood. Do not take these medicines before your procedure if your health care provider instructs you not to.  Plan to have someone take you home after the procedure. PROCEDURE  You may be given a medicine to help you relax (sedative).  You will be given a medicine to make you sleep (general anesthetic).  Your abdomen will be inflated with a gas. This will make your organs easier to see.  Small incisions will be made in your abdomen.  A laparoscope and other small instruments will be inserted into the abdomen through the incisions.  A tissue sample may be removed from an organ in the abdomen for examination.  The instruments will be removed from the abdomen.  The gas will be  released.  The incisions will be closed with stitches (sutures). AFTER THE PROCEDURE  Your blood pressure, heart rate, breathing rate, and blood oxygen level will be monitored often until the medicines you were given have worn off.   This information is not intended to replace advice given to you by your health care provider. Make sure you discuss any questions you have with your health care provider.   Document Released: 02/21/2001 Document Revised: 08/06/2015 Document Reviewed: 06/28/2014 Elsevier Interactive Patient Education 2016 [employer]  

## 2015-12-23 NOTE — Brief Op Note (Signed)
12/23/2015  5:06 PM  PATIENT:  Sierra Mccullough  25 y.o. female  PRE-OPERATIVE DIAGNOSIS:  right ovarian cyst   POST-OPERATIVE DIAGNOSIS:  right ovarian cyst, mild - moderate endometriosis.   PROCEDURE:  Procedure(s): LAPAROSCOPIC OVARIAN CYSTECTOMY WITH PERITONEAL BIOPSY (Right) FULGUATION ENDOMETRIOSIS (N/A) CYSTOSCOPY  SURGEON:  Surgeon(s) and Role:    * Linn Clavin E Yisroel Ramming, MD - Primary    * Salvadore Dom, MD - Assisting  PHYSICIAN ASSISTANT: NA  ASSISTANTS:  Salvadore Dom, MD   ANESTHESIA:   local and general  EBL:  Total I/O In: [id number] [I.V.:2800] Out: 125 [Urine:100; Blood:25]  BLOOD ADMINISTERED:none  DRAINS: none   LOCAL MEDICATIONS USED:  MARCAINE     SPECIMEN:  Source of Specimen:  Right ovarian cyst, peritoneal biopsy.  DISPOSITION OF SPECIMEN:  PATHOLOGY  COUNTS:  YES  TOURNIQUET:  * No tourniquets in log *  DICTATION: .Other Dictation: Dictation Number    PLAN OF CARE: Discharge to home after PACU  PATIENT DISPOSITION:  PACU - hemodynamically stable.   Delay start of Pharmacological VTE agent (>24hrs) due to surgical blood loss or risk of bleeding: not applicable

## 2015-12-23 NOTE — Telephone Encounter (Signed)
Impressions  IMPRESSION:  Enlarged right ovary with complex cysts as described. Recommend followup ultrasound in 2-3 menstrual cycles to ensure resolution.    US PELVIS ENDOVAG DOPPLER (12/23/2015 3:33 AM)  Narrative  INDICATION:  pelvic pain    TECHNIQUE:US PELVIS ENDOVAG DOPPLER   Sonographic grayscale imaging with color Doppler and spectral (waveform) analysis was obtained.     FINDINGS:   The uterus is within normal limits in size.    The endometrial stripe is within normal limits in thickness.    The right ovary is asymmetrically above normal limits in size, measuring 5.6 x 5.2 x 4.8 cm containing a 2 x 2.2 cm complex rounded cystic structure. The remainder of the right ovary is near completely replaced by a large complex cystic structure with   internal strands and nonvascular contents that most likely represent fibrin and clot related products of a hemorrhagic ovarian cyst.    The left ovary demonstrates small normal-sized follicles and is otherwise unremarkable.    Good arterial and venous flow upon color Doppler showed in the bilateral ovaries.   Spectral waveform analysis shows no further acute abnormality.    No abnormal pelvic free fluid.      US PELVIS ENDOVAG DOPPLER (12/23/2015 3:33 AM)  Procedure Note  Acute Interface, Incoming Rad Results - 12/23/2015 4:13 AM EST  INDICATION: pelvic pain   TECHNIQUE: US PELVIS ENDOVAG DOPPLER  Sonographic grayscale imaging with color Doppler and spectral (waveform) analysis was obtained.   FINDINGS:  The uterus is within normal limits in size.  The endometrial stripe is within normal limits in thickness.  The right ovary is asymmetrically above normal limits in size, measuring 5.6 x 5.2 x 4.8 cm containing a 2 x 2.2 cm complex rounded cystic structure. The remainder of the right ovary is near completely replaced by a large complex cystic structure with  internal strands and nonvascular contents  that most likely represent fibrin and clot related products of a hemorrhagic ovarian cyst.  The left ovary demonstrates small normal-sized follicles and is otherwise unremarkable.  Good arterial and venous flow upon color Doppler showed in the bilateral ovaries.  Spectral waveform analysis shows no further acute abnormality.  No abnormal pelvic free fluid.   IMPRESSION: Enlarged right ovary with complex cysts as described. Recommend followup ultrasound in 2-3 menstrual cycles to ensure resolution.

## 2015-12-24 ENCOUNTER — Encounter (HOSPITAL_COMMUNITY): Payer: Self-pay | Admitting: Obstetrics and Gynecology

## 2015-12-24 ENCOUNTER — Telehealth: Payer: Self-pay | Admitting: Obstetrics and Gynecology

## 2015-12-24 NOTE — Telephone Encounter (Signed)
Letter signed. Thank you

## 2015-12-24 NOTE — Telephone Encounter (Signed)
I did tell patient no heavy lifting for 4 weeks post op to avoid increased risk of hernia formation.  I would recommend no lifting over 10 pounds for 4 weeks.   Thanks.

## 2015-12-24 NOTE — Telephone Encounter (Signed)
Spoke with patient. Advised he return to work letter has been written and is ready for pick up tomorrow. Patient is asking if the letter states when she returns to work she can not lift anything over 10 pounds for a couple of weeks. 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 Advised she can return to work without restrictions after 2 weeks. Patient would like for me to clarify with Dr.Silva and return call.

## 2015-12-24 NOTE — Telephone Encounter (Signed)
Return to work letter written. Saved in EPIC and copy printed for Dr.Silva's review prior to placing at the front for pick up.

## 2015-12-24 NOTE — Telephone Encounter (Signed)
Patient calling needs a return to work note for 01/02/2016, patient said her mom can pick it up tomorrow. Patient is also scheduled for 2 week post op 01/08/2016 with Dr. Quincy Simmonds. Best # to reach: [phone]

## 2015-12-25 NOTE — Telephone Encounter (Signed)
Letter revised and to Dr.Silva for review prior to notifying patient it is ready for pick up.

## 2015-12-25 NOTE — Telephone Encounter (Signed)
Spoke with patient. Advised of revisions made to letter per Dr.Silva's recommendations. Patient is agreeable. Patient's mother [name] will pick up the letter today. Letter placed in sealed envelop with the patient's name and DOB to the front for pick up.  Routing to provider for final review. Patient agreeable to disposition. Will close encounter.

## 2015-12-25 NOTE — Telephone Encounter (Signed)
Patient's mom called, Nashika Mussen (no DPR on file to share PHI), to check on the status of the request. She'd like to pick the letter up on her way home from work today, if possible. She gets off at 12:00 PM.

## 2015-12-29 ENCOUNTER — Encounter: Payer: Self-pay | Admitting: Family Medicine

## 2015-12-29 ENCOUNTER — Ambulatory Visit (INDEPENDENT_AMBULATORY_CARE_PROVIDER_SITE_OTHER): Payer: BLUE CROSS/BLUE SHIELD | Admitting: Family Medicine

## 2015-12-29 ENCOUNTER — Telehealth: Payer: Self-pay | Admitting: Emergency Medicine

## 2015-12-29 VITALS — BP 127/69 | HR 101 | Wt 144.0 lb

## 2015-12-29 DIAGNOSIS — D649 Anemia, unspecified: Secondary | ICD-10-CM

## 2015-12-29 DIAGNOSIS — R55 Syncope and collapse: Secondary | ICD-10-CM

## 2015-12-29 NOTE — Telephone Encounter (Signed)
-----   Message from Nunzio Cobbs, MD sent at 12/26/2015 11:59 AM EST ----- Please report surgical pathology results to patient.   She had a hemorrhagic corpus luteum cyst of her ovary.  (I removed this.) Her biopsy of the peritoneum did not confirm endometriosis, but she sure had signs of this!  It just was not reflected in this particular biopsy.   I hope she is recovering well!

## 2015-12-29 NOTE — Progress Notes (Signed)
Subjective:    Mccullough ID: Sierra Mccullough, female    DOB: 1991-07-22, 25 y.o.   MRN: [id number]  HPI  Wokeup this AM and was burshing her teeth when started feeling hot and clamy. Almost felt nauseated and started leaning over the toilet. Says that was the last thing she remembered.  Sister found her in the hall way. She only lost consciousness for a few seconda but when awoke she vomited. Called EMS and BP was 89/50.    Just had a cyst on her ovary that was drained about a week. Ago.  Hasn't taken oxycodone in a couple of days.  She has had a syncopal episode previously. In fact referred her to cardiology about 7 months ago back in June for palpitations and possible abnormal EKG. Her echocardiogram was normal. No structural disease was seen. She also had a 48-hour Holter monitor that showed normal sinus rhythm with sinus bradycardia. There are some rare PACs and PVCs. No new medications.  Not skipping meals. This happened right aftr got out of bed this AM.    No URI or fever, chills or sweats.    Review of Systems   BP 127/69 mmHg  Pulse 101  Wt 144 lb (65.318 kg)  SpO2 100%  LMP 12/21/2015    Allergies  Allergen Reactions  . Keflex [Cephalexin] Diarrhea and Other (See Comments)    Abdominal pain   . Sulfonamide Derivatives Other (See Comments)    Unknown reaction    Past Medical History  Diagnosis Date  . Nephrolithiasis   . Dyspareunia   . Migraines   . Liver mass     Past Surgical History  Procedure Laterality Date  . Urethral stent  2011    due to kidney stones  . Laparoscopic ovarian cystectomy Right 12/23/2015    Procedure: LAPAROSCOPIC OVARIAN CYSTECTOMY WITH PERITONEAL BIOPSY;  Surgeon: Nunzio Cobbs, MD;  Location: [facility];  Service: Gynecology;  Laterality: Right;  . Wart fulguration N/A 12/23/2015    Procedure: FULGUATION ENDOMETRIOSIS;  Surgeon: Nunzio Cobbs, MD;  Location: [facility];  Service: Gynecology;  Laterality: N/A;  . Cystoscopy   12/23/2015    Procedure: CYSTOSCOPY;  Surgeon: Nunzio Cobbs, MD;  Location: [facility];  Service: Gynecology;;    Social History   Social History  . Marital Status: Single    Spouse Name: N/A  . Number of Children: N/A  . Years of Education: N/A   Occupational History  . Not on file.   Social History Main Topics  . Smoking status: Never Smoker   . Smokeless tobacco: Never Used  . Alcohol Use: No  . Drug Use: No  . Sexual Activity:    Partners: Male    Birth Control/ Protection: Abstinence   Other Topics Concern  . Not on file   Social History Narrative   Born in [place].  Mother is Writer and father is in Architect.  Lives with mother Sierra Mccullough and sister Sierra Mccullough.   Resp therapy student.    Family History  Problem Relation Age of Onset  . Hypertension Mother   . Diabetes Mother   . Migraines Mother   . Diabetes Maternal Grandfather   . Heart disease Paternal Grandfather     Heart valved disease, died of MI age 103  . Heart disease Father     "ventricular tachycardia"  treated with beta blockers.   . Heart disease Paternal Uncle 35  ICD, MI  . Heart disease Paternal Aunt     Needs valve replacment CHF    Outpatient Encounter Prescriptions as of 12/29/2015  Medication Sig  . [DISCONTINUED] ibuprofen (ADVIL,MOTRIN) 800 MG tablet Take 1 tablet (800 mg total) by mouth every 8 (eight) hours as needed.  . [DISCONTINUED] oxyCODONE-acetaminophen (PERCOCET/ROXICET) 5-325 MG tablet Take by mouth.   No facility-administered encounter medications on file as of 12/29/2015.          Objective:   Physical Exam  Constitutional: She is oriented to person, place, and time. She appears well-developed and well-nourished.  HENT:  Head: Normocephalic and atraumatic.  Right Ear: External ear normal.  Left Ear: External ear normal.  Nose: Nose normal.  Mouth/Throat: Oropharynx is clear and moist.  TMs and canals are clear.   Eyes: Conjunctivae and  EOM are normal. Pupils are equal, round, and reactive to light.  Neck: Neck supple. No thyromegaly present.  Cardiovascular: Normal rate, regular rhythm and normal heart sounds.   Pulmonary/Chest: Effort normal and breath sounds normal. She has no wheezes.  Musculoskeletal: She exhibits no edema.  Lymphadenopathy:    She has no cervical adenopathy.  Neurological: She is alert and oriented to person, place, and time.  Skin: Skin is warm and dry.  Psychiatric: She has a normal mood and affect.          Assessment & Plan:  Syncope-unclear etiology. Orthostatics are normal. She's no longer feeling lightheaded or dizzy. Consider could have been hypoglycemia as well. She had a normal workup with echocardiogram and 48-hour Holter monitor about 6 months ago. We'll do some lab work to evaluate for anemia and thyroid disorder. Will call with results once available. EKG shows rate of 77 bpm, normal sinus rhythm with normal axis and no acute ST-T wave changes. If it occurs again and please let me know immediately and will refer her to cardiology for further evaluation She does have a hx of low hemoglobin.

## 2015-12-29 NOTE — Telephone Encounter (Signed)
Phone call to patient in follow up to the message received from triage.  Felt fine all weekend, but had trouble breathing and face felt funny while she was brushing her teeth this morning.    Lost consciousness. When she regained consciousness, she started vomiting.  EMS came and told her her blood pressure was low - 94/60s. Told glucose was normal.  She declined evaluation at hospital.  No pain medication in days. Eating and drinking well.  No continued shortness of breath.  Sister will drive her to her PCP appointment today.

## 2015-12-29 NOTE — Telephone Encounter (Signed)
Call to patient. She is given results of pathology report and verbalized understanding.   She states she was doing well until this morning when she passed out and then regained consciousness and started vomiting. She states her Mother called 911 and BP was low. Patient reports she is feeling better at this time.   She has an appointment today to see her PCP Dr. Madilyn Fireman. She is advised to call back if she needs any assistance from our office.   Has follow up post op appointment with Dr. Quincy Simmonds 01/08/16.   Advised will send update to Dr. Quincy Simmonds and return call if any further message from Dr. Quincy Simmonds. Patient agreeable.

## 2015-12-29 NOTE — Progress Notes (Signed)
Subjective:     Patient ID: Sierra Mccullough, female   DOB: 1991-07-30, 25 y.o.   MRN: [id number]  HPI 25 yo G0 SWF here for follow up from the ER last night where pt went initially with the feeling of having a UTI.  Evaluation in the ER included a PUS showing 5.6 x 5.2 x 4.8cm with a 2.2cm complex structure within consistent with hemorrhagic cyst.  Pt reports since last night, pain has worsened and she feels it is a 7/10 despite taking two Percocet tablets x 2.  Pain is worse on the right.  She is having some associated nausea.  Denies dysuria but she is feeling pelvic pressure/bladder pressure.  She thinks this now is why she thought she had a UTI last night.  No low back pain.  Has not had a bowel movement in last 24 hours.  Is passing flatus.  Review of Systems  Constitutional: Negative for appetite change (decreased).  Gastrointestinal: Positive for abdominal pain and abdominal distention. Negative for diarrhea, constipation and blood in stool.  Endocrine: Negative for cold intolerance and heat intolerance.  All other systems reviewed and are negative.      Objective:   Physical Exam  Constitutional: She is oriented to person, place, and time. She appears well-developed and well-nourished.  HENT:  Head: Normocephalic and atraumatic.  Cardiovascular: Normal rate and regular rhythm.   Pulmonary/Chest: Effort normal and breath sounds normal.  Abdominal: Soft. Bowel sounds are normal. She exhibits no distension and no mass. There is no hepatosplenomegaly. There is tenderness (RLQ). There is CVA tenderness. There is no rigidity, no rebound and no guarding. No hernia. Hernia confirmed negative in the right inguinal area and confirmed negative in the left inguinal area.  Neurological: She is alert and oriented to person, place, and time.  Appears uncomfortable.  Skin: Skin is warm and dry. She is not diaphoretic. No pallor.  Psychiatric: She has a normal mood and affect.   D/w pt conservative  management.  Pt has benign liver mass and has been taken off OCPs.  Typically, would start pt on OCPs to help with resolution of cyst so pt is aware this will make the process to resolution slower.  D/w pt proceeding with surgery.  She and Dr. Quincy Simmonds have discussed in the past laparoscopy for evaluation of endometriosis.  This would be done at the same time at treatment of ovarian cyst.  Procedure reviewed with pt.  Risks and benefits removed.  Doubtful of need to remove ovary.  Bleeding, infection, bowel/bladder/ureteral injury discussed.  DVT and PE discussed with prophylaxis.  Pt was with me in my office to contact Dr. Quincy Simmonds who was completing with OR cases today.  Due to pt's pain, I really feel surgical treatment is appropriate.  Pt's mother was also with pt so we reviewed options as well.  She is agreement with proceeding surgically as well.      Assessment:     Hemorrhagic right ovarian cyst Pelvic pain History concerning for endometriosis     Plan:     Awaiting Dr. Quincy Simmonds for repeat examination as well as review of surgery.  Pt is NPO so will contact OR for scheduling today if possible.  All questions answered.  ~25 minutes spent with patient >50% of time was in face to face discussion of above.

## 2015-12-30 LAB — COMPLETE METABOLIC PANEL WITH GFR
ALT: 32 U/L — AB (ref 6–29)
AST: 21 U/L (ref 10–30)
Albumin: 4.4 g/dL (ref 3.6–5.1)
Alkaline Phosphatase: 69 U/L (ref 33–115)
BILIRUBIN TOTAL: 0.3 mg/dL (ref 0.2–1.2)
BUN: 15 mg/dL (ref 7–25)
CALCIUM: 9.3 mg/dL (ref 8.6–10.2)
CHLORIDE: 103 mmol/L (ref 98–110)
CO2: 26 mmol/L (ref 20–31)
CREATININE: 0.71 mg/dL (ref 0.50–1.10)
GFR, Est Non African American: >89 mL/min (ref >=60)
Glucose, Bld: 86 mg/dL (ref 65–99)
Potassium: 4.9 mmol/L (ref 3.5–5.3)
Sodium: 146 mmol/L (ref 135–146)
TOTAL PROTEIN: 7 g/dL (ref 6.1–8.1)

## 2015-12-30 LAB — IRON: IRON: 78 ug/dL (ref 40–190)

## 2015-12-30 LAB — CBC WITH DIFFERENTIAL/PLATELET
BASOS ABS: 0 10*3/uL (ref 0.0–0.1)
BASOS PCT: 0 % (ref 0–1)
Eosinophils Absolute: 0.1 10*3/uL (ref 0.0–0.7)
Eosinophils Relative: 1 % (ref 0–5)
HEMATOCRIT: 39.1 % (ref 36.0–46.0)
HEMOGLOBIN: 12.7 g/dL (ref 12.0–15.0)
LYMPHS PCT: 25 % (ref 12–46)
Lymphs Abs: 2 10*3/uL (ref 0.7–4.0)
MCH: 26 pg (ref 26.0–34.0)
MCHC: 32.5 g/dL (ref 30.0–36.0)
MCV: 80.1 fL (ref 78.0–100.0)
MONO ABS: 0.5 10*3/uL (ref 0.1–1.0)
MPV: 8.9 fL (ref 8.6–12.4)
Monocytes Relative: 6 % (ref 3–12)
NEUTROS ABS: 5.5 10*3/uL (ref 1.7–7.7)
NEUTROS PCT: 68 % (ref 43–77)
Platelets: 381 10*3/uL (ref 150–400)
RBC: 4.88 MIL/uL (ref 3.87–5.11)
RDW: 13.6 % (ref 11.5–15.5)
WBC: 8.1 10*3/uL (ref 4.0–10.5)

## 2015-12-30 LAB — TSH: TSH: 0.571 u[IU]/mL (ref 0.350–4.500)

## 2015-12-30 LAB — FERRITIN: FERRITIN: 59 ng/mL (ref 10–291)

## 2016-01-08 ENCOUNTER — Ambulatory Visit: Payer: BLUE CROSS/BLUE SHIELD | Admitting: Obstetrics and Gynecology

## 2016-01-08 ENCOUNTER — Ambulatory Visit (INDEPENDENT_AMBULATORY_CARE_PROVIDER_SITE_OTHER): Payer: BLUE CROSS/BLUE SHIELD | Admitting: Obstetrics and Gynecology

## 2016-01-08 ENCOUNTER — Encounter: Payer: Self-pay | Admitting: Obstetrics and Gynecology

## 2016-01-08 VITALS — BP 116/72 | HR 62 | Ht 64.0 in | Wt 142.0 lb

## 2016-01-08 DIAGNOSIS — K7689 Other specified diseases of liver: Secondary | ICD-10-CM | POA: Diagnosis not present

## 2016-01-08 DIAGNOSIS — N946 Dysmenorrhea, unspecified: Secondary | ICD-10-CM

## 2016-01-08 DIAGNOSIS — N83201 Unspecified ovarian cyst, right side: Secondary | ICD-10-CM

## 2016-01-08 DIAGNOSIS — Z9889 Other specified postprocedural states: Secondary | ICD-10-CM

## 2016-01-08 NOTE — Progress Notes (Signed)
Patient ID: Sierra Mccullough, female   DOB: August 07, 1991, 25 y.o.   MRN: [id number] GYNECOLOGY  VISIT   HPI: 25 y.o.   Single  Caucasian  female   G0P0 with Patient's last menstrual period was 12/21/2015.   here for 2 week follow up [place] (Right Abdomen)  FULGUATION ENDOMETRIOSIS (N/A Abdomen)  CYSTOSCOPY (Urethra).   Pathology showed a hemorrhagic corpus luteum cyst and no confirmed endometriosis if the peritoneal biopsy.  She had visible signs of endometriosis.  Syncopal episode 6 days post op.  Told her blood pressure was low by EMS services. Saw her PCP in follow up.  Normal labs. Will see cardiology if this occurs.   GYNECOLOGIC HISTORY: Patient's last menstrual period was 12/21/2015. Contraception:Abstinence Menopausal hormone therapy: n/a Last mammogram: n/a Last pap smear: 06-06-15 Neg        OB History    Gravida Para Term Preterm AB TAB SAB Ectopic Multiple Living   0 0                 Patient Active Problem List   Diagnosis Date Noted  . Abnormal EKG 05/05/2015  . Palpitation 05/05/2015  . Right cervical radiculopathy 09/09/2014  . History of nephrolithiasis 11/10/2011  . Menorrhagia 11/10/2011    Past Medical History  Diagnosis Date  . Nephrolithiasis   . Dyspareunia   . Migraines   . Liver mass     Past Surgical History  Procedure Laterality Date  . Urethral stent  2011    due to kidney stones  . Laparoscopic ovarian cystectomy Right 12/23/2015    Procedure: LAPAROSCOPIC OVARIAN CYSTECTOMY WITH PERITONEAL BIOPSY;  Surgeon: Nunzio Cobbs, MD;  Location: [facility];  Service: Gynecology;  Laterality: Right;  . Wart fulguration N/A 12/23/2015    Procedure: FULGUATION ENDOMETRIOSIS;  Surgeon: Nunzio Cobbs, MD;  Location: [facility];  Service: Gynecology;  Laterality: N/A;  . Cystoscopy  12/23/2015    Procedure: CYSTOSCOPY;  Surgeon: Nunzio Cobbs, MD;  Location: [facility];  Service:  Gynecology;;    No current outpatient prescriptions on file.   No current facility-administered medications for this visit.     ALLERGIES: Keflex and Sulfonamide derivatives  Family History  Problem Relation Age of Onset  . Hypertension Mother   . Diabetes Mother   . Migraines Mother   . Diabetes Maternal Grandfather   . Heart disease Paternal Grandfather     Heart valved disease, died of MI age 70  . Heart disease Father     "ventricular tachycardia"  treated with beta blockers.   . Heart disease Paternal Uncle 39    ICD, MI  . Heart disease Paternal Aunt     Needs valve replacment CHF    Social History   Social History  . Marital Status: Single    Spouse Name: N/A  . Number of Children: N/A  . Years of Education: N/A   Occupational History  . Not on file.   Social History Main Topics  . Smoking status: Never Smoker   . Smokeless tobacco: Never Used  . Alcohol Use: No  . Drug Use: No  . Sexual Activity:    Partners: Male    Birth Control/ Protection: Abstinence   Other Topics Concern  . Not on file   Social History Narrative   Born in [place].  Mother is Writer and father is in Architect.  Lives with mother  Charlene and sister [name].   Resp therapy student.    ROS:  Pertinent items are noted in HPI.  PHYSICAL EXAMINATION:    BP 116/72 mmHg  Pulse 62  Ht 5\' 4"  (1.626 m)  Wt 142 lb (64.411 kg)  BMI 24.36 kg/m2  LMP 12/21/2015    General appearance: alert, cooperative and appears stated age   Abdomen: incisions intact, soft, non-tender; bowel sounds normal; no masses,  no organomegaly   Pelvic: External genitalia:  no lesions              Urethra:  normal appearing urethra with no masses, tenderness or lesions              Bartholins and Skenes: normal                 Vagina: normal appearing vagina with normal color and discharge, no lesions              Cervix: no lesions                Bimanual Exam:  Uterus:  normal  size, contour, position, consistency, mobility, non-tender              Adnexa: no mass, fullness, tenderness on left.  Right adnexal region is tender.            Chaperone was present for exam.  ASSESSMENT  Status post laparoscopic right ovarian cystectomy, fulguration of endometriosis, peritoneal biopsy, cystoscopy.  Hx dysmenorrhea. Hx focal nodular hyperplasia of the liver.  PLAN  Counseled regarding endometriosis, dysmenorrhea, and ovarian cysts.  We had a detailed discussion of options for treating endometriosis and pain - oral contraceptives, Depo Provera, progesterone IUDs - Mirena, [place], [place]. We discussed risk and benefits of each.  We also reviewed focal nodular hyperplasia and contraceptive use in Up to Date during her consultation visit. Although it is not contraindicated to prescribe combined oral contraceptives, there can be an increased rare chance of rupture and bleeding.  The liver would need to be monitored with combined oral contraceptive treatment.  We will pursue a progesterone IUD, hopefully the Mirena IUD.   An After Visit Summary was printed and given to the patient.  __25____ minutes face to face time of which over 50% was spent in counseling regarding endometriosis, dysmenorrhea, and ovarian cysts - treatment options.

## 2016-01-08 NOTE — Patient Instructions (Signed)
Endometriosis Endometriosis is a condition in which the tissue that lines the uterus (endometrium) grows outside of its normal location. The tissue may grow in many locations close to the uterus, but it commonly grows on the ovaries, fallopian tubes, vagina, or bowel. Because the uterus expels, or sheds, its lining every menstrual cycle, there is bleeding wherever the endometrial tissue is located. This can cause pain because blood is irritating to tissues not normally exposed to it.  CAUSES  The cause of endometriosis is not known.  SIGNS AND SYMPTOMS  Often, there are no symptoms. When symptoms are present, they can vary with the location of the displaced tissue. Various symptoms can occur at different times. Although symptoms occur mainly during a woman's menstrual period, they can also occur midcycle and usually stop with menopause. Some people may go months with no symptoms at all. Symptoms may include:   Back or abdominal pain.   Heavier bleeding during periods.   Pain during intercourse.   Painful bowel movements.   Infertility. DIAGNOSIS  Your health care provider will do a physical exam and ask about your symptoms. Various tests may be done, such as:   Blood tests and urine tests. These are done to help rule out other problems.   Ultrasound. This test is done to look for abnormal tissue.   An X-ray of the lower bowel (barium enema).  Laparoscopy. In this procedure, a thin, lighted tube with a tiny camera on the end (laparoscope) is inserted into your abdomen. This helps your health care provider look for abnormal tissue to confirm the diagnosis. The health care provider may also remove a small piece of tissue (biopsy) from any abnormal tissue found. This tissue sample can then be sent to a lab so it can be looked at under a microscope. TREATMENT  Treatment will vary and may include:   Medicines to relieve pain. Nonsteroidal anti-inflammatory drugs (NSAIDs) are a type of  pain medicine that can help to relieve the pain caused by endometriosis.  Hormonal therapy. When using hormonal therapy, periods are eliminated. This eliminates the monthly exposure to blood by the displaced endometrial tissue.   Surgery. Surgery may sometimes be done to remove the abnormal endometrial tissue. In severe cases, surgery may be done to remove the fallopian tubes, uterus, and ovaries (hysterectomy). HOME CARE INSTRUCTIONS   Take all medicines as directed by your health care provider. Do not take aspirin because it may increase bleeding when you are not on hormonal therapy.   Avoid activities that produce pain, including sexual activity. SEEK MEDICAL CARE IF:  You have pelvic pain before, after, or during your periods.  You have pelvic pain between periods that gets worse during your period.  You have pelvic pain during or after sex.  You have pelvic pain with bowel movements or urination, especially during your period.  You have problems getting pregnant.  You have a fever. SEEK IMMEDIATE MEDICAL CARE IF:   Your pain is severe and is not responding to pain medicine.   You have severe nausea and vomiting, or you cannot keep foods down.   You have pain that is limited to the right lower part of your abdomen.   You have swelling or increasing pain in your abdomen.   You see blood in your stool.  MAKE SURE YOU:   Understand these instructions.  Will watch your condition.  Will get help right away if you are not doing well or get worse.   This information   is not intended to replace advice given to you by your health care provider. Make sure you discuss any questions you have with your health care provider.   Document Released: 11/12/2000 Document Revised: 12/06/2014 Document Reviewed: 07/13/2013 Elsevier Interactive Patient Education 2016 Elsevier Inc.  

## 2016-01-10 ENCOUNTER — Encounter: Payer: Self-pay | Admitting: Obstetrics and Gynecology

## 2016-01-20 ENCOUNTER — Telehealth: Payer: Self-pay | Admitting: Obstetrics and Gynecology

## 2016-01-20 NOTE — Telephone Encounter (Signed)
Left message to call Terreon Ekholm at [phone]. 

## 2016-01-20 NOTE — Telephone Encounter (Signed)
Patient says she is on her cycle (day 2) and is ready to schedule her Mirena IUD Insertion which is covered 100% by her insurance per patient. Best # to reach: [phone]

## 2016-01-21 MED ORDER — MISOPROSTOL 200 MCG PO TABS: ORAL_TABLET | ORAL | Status: DC

## 2016-01-21 NOTE — Telephone Encounter (Signed)
Spoke with patient. Patient states she started her cycle on 01/18/2016 and would like to schedule an appointment for her Mirena insertion. Appointment scheduled for 01/22/2016 at 2:30 pm. She is agreeable to date and time. Pre procedure instructions given.  Motrin instructions given. Motrin=Advil=Ibuprofen, 800 mg one hour before appointment. Eat a meal and hydrate well before appointment. Cytotec instructions given. Place 1 tablet PV night before the procedure. Place 1 tablet PV the morning of the procedure. Rx for Cytotec 200 mcg #2 0RF sent to pharmacy on file. She is agreeable.  Routing to provider for final review. Patient agreeable to disposition. Will close encounter.

## 2016-01-22 ENCOUNTER — Ambulatory Visit (INDEPENDENT_AMBULATORY_CARE_PROVIDER_SITE_OTHER): Payer: BLUE CROSS/BLUE SHIELD | Admitting: Obstetrics and Gynecology

## 2016-01-22 ENCOUNTER — Encounter: Payer: Self-pay | Admitting: Obstetrics and Gynecology

## 2016-01-22 VITALS — BP 116/72 | HR 74 | Resp 16 | Ht 63.0 in | Wt 146.6 lb

## 2016-01-22 DIAGNOSIS — Z30430 Encounter for insertion of intrauterine contraceptive device: Secondary | ICD-10-CM

## 2016-01-22 DIAGNOSIS — N946 Dysmenorrhea, unspecified: Secondary | ICD-10-CM

## 2016-01-22 NOTE — Progress Notes (Signed)
GYNECOLOGY  VISIT   HPI: 25 y.o.   Single  Caucasian  female   G0P0 with Patient's last menstrual period was 01/18/2016 (exact date).  here for Mirena IUD placement.  Took Cytotec and Motrin in preparation for the visit today.   Has dysmenorrhea.  Recent laparoscopic surgery demonstrating signs of endometriosis although biopsy was negative.   UPT negative.   GYNECOLOGIC HISTORY: Patient's last menstrual period was 01/18/2016 (exact date). Contraception:Abstinence  Last pap smear: 05/2015-Normal        OB History    Gravida Para Term Preterm AB TAB SAB Ectopic Multiple Living   0 0                 Patient Active Problem List   Diagnosis Date Noted  . Abnormal EKG 05/05/2015  . Palpitation 05/05/2015  . Right cervical radiculopathy 09/09/2014  . History of nephrolithiasis 11/10/2011  . Menorrhagia 11/10/2011    Past Medical History  Diagnosis Date  . Nephrolithiasis   . Dyspareunia   . Migraines   . Liver mass     focal nodular hyperplasia    Past Surgical History  Procedure Laterality Date  . Urethral stent  2011    due to kidney stones  . Laparoscopic ovarian cystectomy Right 12/23/2015    Procedure: LAPAROSCOPIC OVARIAN CYSTECTOMY WITH PERITONEAL BIOPSY;  Surgeon: Nunzio Cobbs, MD;  Location: [facility];  Service: Gynecology;  Laterality: Right;  . Wart fulguration N/A 12/23/2015    Procedure: FULGUATION ENDOMETRIOSIS;  Surgeon: Nunzio Cobbs, MD;  Location: [facility];  Service: Gynecology;  Laterality: N/A;  . Cystoscopy  12/23/2015    Procedure: CYSTOSCOPY;  Surgeon: Nunzio Cobbs, MD;  Location: [facility];  Service: Gynecology;;    Current Outpatient Prescriptions  Medication Sig Dispense Refill  . misoprostol (CYTOTEC) 200 MCG tablet Place 1 tablet PV night before the procedure. Place 1 tablet PV the morning of the procedure. 2 tablet 0   No current facility-administered medications for this visit.     ALLERGIES: Keflex and  Sulfonamide derivatives  Family History  Problem Relation Age of Onset  . Hypertension Mother   . Diabetes Mother   . Migraines Mother   . Diabetes Maternal Grandfather   . Heart disease Paternal Grandfather     Heart valved disease, died of MI age 84  . Heart disease Father     "ventricular tachycardia"  treated with beta blockers.   . Heart disease Paternal Uncle 24    ICD, MI  . Heart disease Paternal Aunt     Needs valve replacment CHF    Social History   Social History  . Marital Status: Single    Spouse Name: N/A  . Number of Children: N/A  . Years of Education: N/A   Occupational History  . Not on file.   Social History Main Topics  . Smoking status: Never Smoker   . Smokeless tobacco: Never Used  . Alcohol Use: No  . Drug Use: No  . Sexual Activity:    Partners: Male    Birth Control/ Protection: Abstinence   Other Topics Concern  . Not on file   Social History Narrative   Born in [place].  Mother is Writer and father is in Architect.  Lives with mother [name] and sister [name].   Resp therapy student.    ROS:  Pertinent items are noted in HPI.  PHYSICAL EXAMINATION:    BP  116/72 mmHg  Pulse 74  Resp 16  Ht 5\' 3"  (1.6 m)  Wt 146 lb 9.6 oz (66.497 kg)  BMI 25.98 kg/m2  LMP 01/18/2016 (Exact Date)    General appearance: alert, cooperative and appears stated age   Pelvic: External genitalia:  no lesions              Urethra:  normal appearing urethra with no masses, tenderness or lesions              Bartholins and Skenes: normal                 Vagina: normal appearing vagina with normal color and discharge, no lesions              Cervix: menstrual flow.            Bimanual Exam:  Uterus:  normal size, contour, position, consistency, mobility, non-tender              Adnexa: normal adnexa and no mass, fullness, tenderness          Mirena IUD placement. Lot number [id number], expiration 05/19 Consent for procedure.   Speculum placed in the vagina.  Sterile prep with Hibiclens.  Paracervical block with 10 cc 1% lidocaine.  Lot number [id number], expiration 01/27/17. Uterus sounded to almost 7 cm.  Mirena IUD placed without difficulty.  Strings trimmed.  No complications.  Minimal EBL.   Chaperone was present for exam.  ASSESSMENT  Mirena IUD placement for dysmenorrhea and endometriosis.  Hx of liver adenomas.  PLAN  Instructions and precautions given.  IUD card given.  Motrin for cramping.  IUD check in 5 weeks.   An After Visit Summary was printed and given to the patient.

## 2016-01-22 NOTE — Patient Instructions (Signed)

## 2016-01-28 IMAGING — CR DG CERVICAL SPINE COMPLETE 4+V
6 series · 6 of 6 positions shown · non-contrast
Comparison: None.

CLINICAL DATA: Right-sided neck and shoulder pain with tingling
right arm. Right cervical radiculopathy.

EXAM:
CERVICAL SPINE  4+ VIEWS

[view not recorded (1 of 6)]
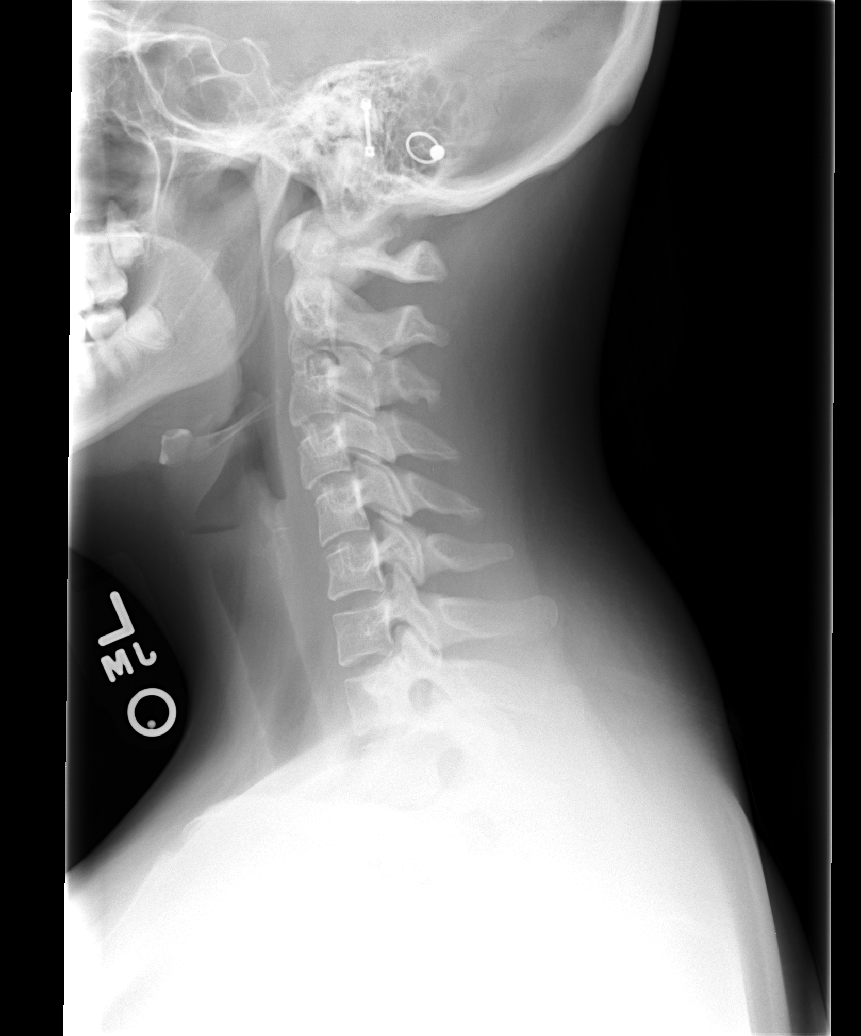

[view not recorded (2 of 6)]
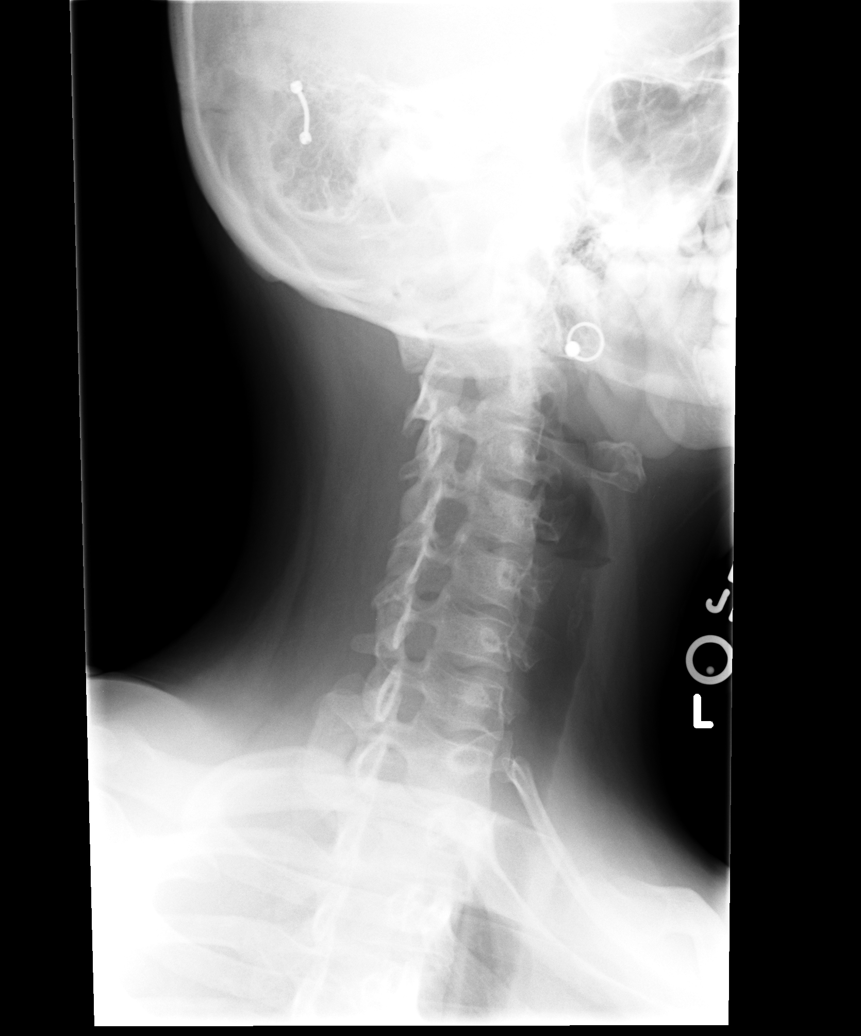

[view not recorded (3 of 6)]
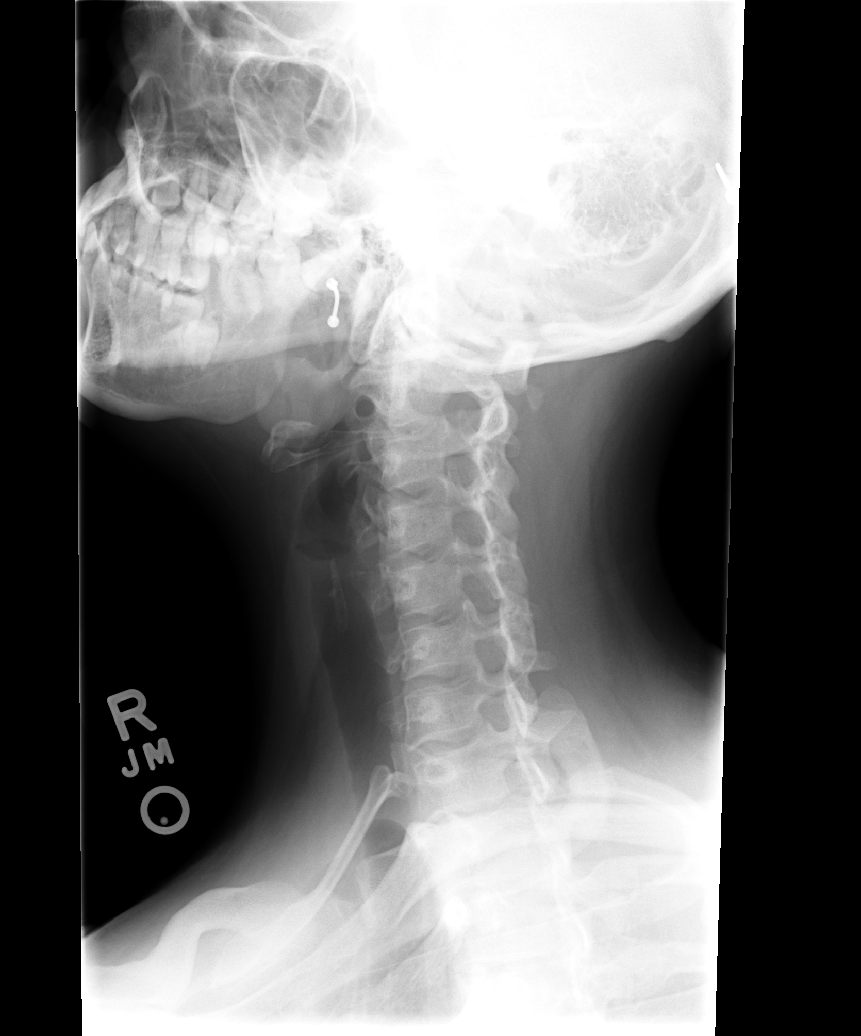

[view not recorded (4 of 6)]
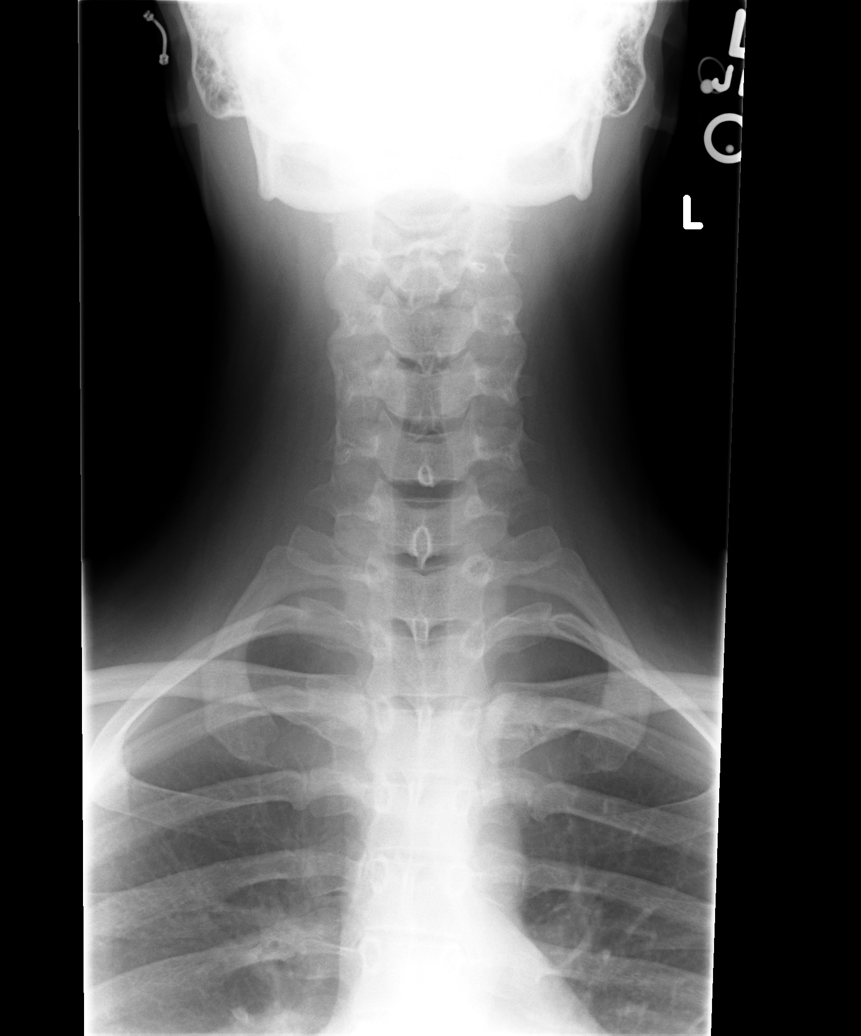

[view not recorded (5 of 6)]
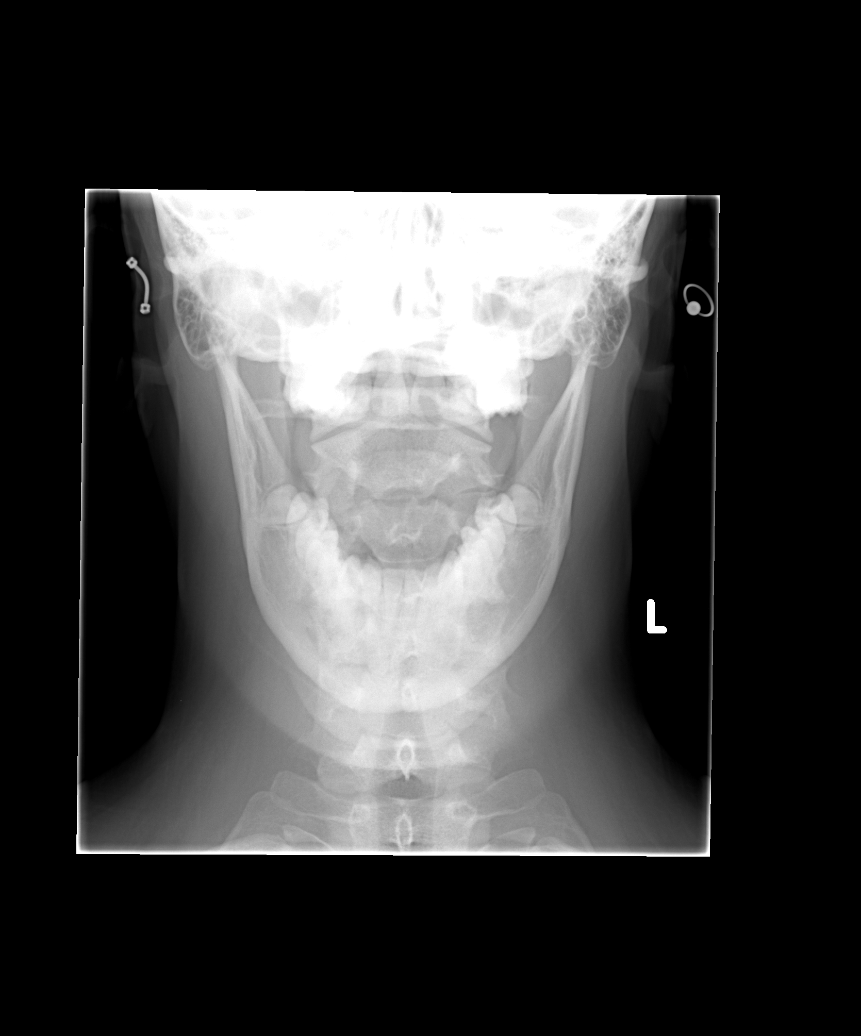

[view not recorded (6 of 6)]
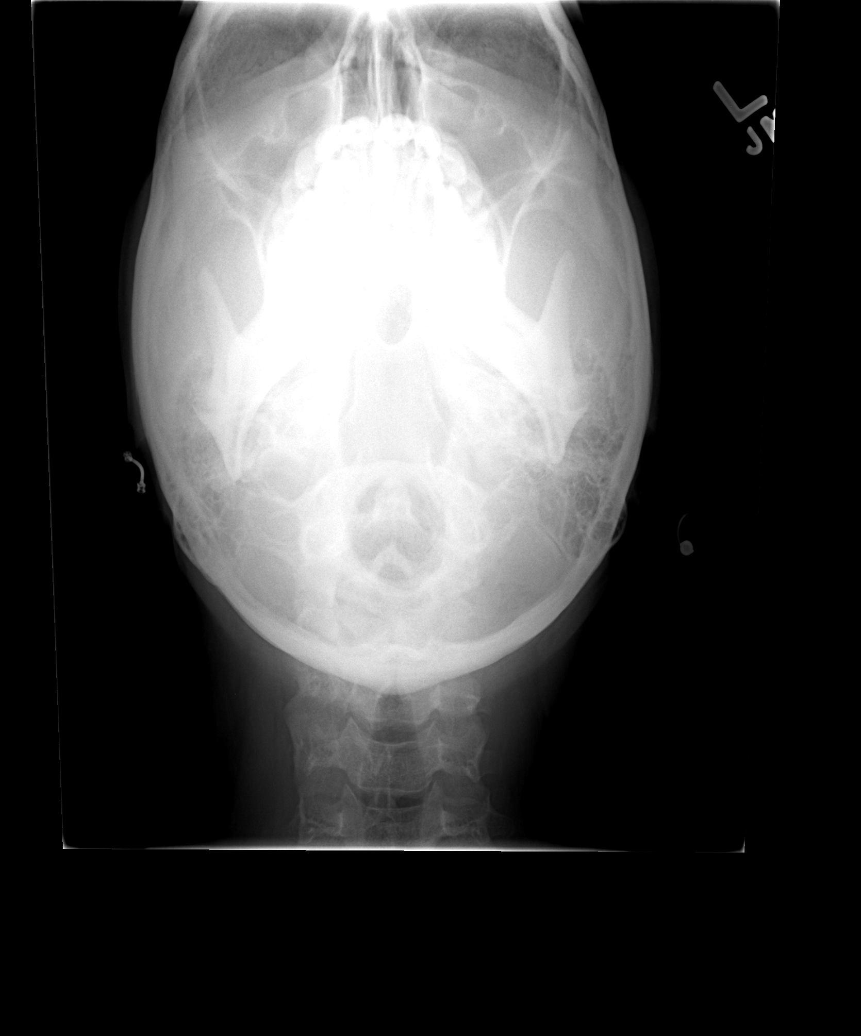

[6 of 6 positions shown; findings below may reference images not displayed]

FINDINGS: There is no evidence of cervical spine fracture or prevertebral soft
tissue swelling. Alignment is normal. No other significant bone
abnormalities are identified.
IMPRESSION: Normal exam.

## 2016-02-26 ENCOUNTER — Ambulatory Visit (INDEPENDENT_AMBULATORY_CARE_PROVIDER_SITE_OTHER): Payer: BLUE CROSS/BLUE SHIELD

## 2016-02-26 ENCOUNTER — Encounter: Payer: Self-pay | Admitting: Obstetrics and Gynecology

## 2016-02-26 ENCOUNTER — Ambulatory Visit (INDEPENDENT_AMBULATORY_CARE_PROVIDER_SITE_OTHER): Payer: BLUE CROSS/BLUE SHIELD | Admitting: Obstetrics and Gynecology

## 2016-02-26 VITALS — BP 108/70 | HR 66 | Ht 64.0 in | Wt 148.8 lb

## 2016-02-26 DIAGNOSIS — N949 Unspecified condition associated with female genital organs and menstrual cycle: Secondary | ICD-10-CM | POA: Diagnosis not present

## 2016-02-26 DIAGNOSIS — N83201 Unspecified ovarian cyst, right side: Secondary | ICD-10-CM

## 2016-02-26 DIAGNOSIS — R102 Pelvic and perineal pain: Secondary | ICD-10-CM

## 2016-02-26 DIAGNOSIS — Z30431 Encounter for routine checking of intrauterine contraceptive device: Secondary | ICD-10-CM | POA: Diagnosis not present

## 2016-02-26 DIAGNOSIS — N83202 Unspecified ovarian cyst, left side: Secondary | ICD-10-CM

## 2016-02-26 MED ORDER — IBUPROFEN 800 MG PO TABS
800.0000 mg | ORAL_TABLET | Freq: Three times a day (TID) | ORAL | Status: DC | PRN
Start: 2016-02-26 — End: 2017-03-28

## 2016-02-26 NOTE — Progress Notes (Signed)
Subjective  25 y.o. G19P0 Single Caucasian female here for pelvic ultrasound for pelvic pain.  Has Mirena IUD.  No LMP recorded. Patient is not currently having periods (Reason: IUD).  Objective  Pelvic ultrasound images and report reviewed with patient.  Uterus - no fibroids. EMS - 8.57 mm.  IUD in canal.  6 mm mass versus clot. Ovaries - right ovary 13 mm CL cyst.  Left ovary with 28 x 25 mm thin walled cyst with septation.  Free fluid - no    Assessment  IUD in proper position.  Ovarian cysts.  No sign of endometriosis.  Endometrial mass versus clot.  I suspect a clot. Focal nodular hyperplasia of liver.   Plan  Observation of cramping and bleeding.  Bleeding profile of Mirena reviewed and patient understands this. She states she will try to be patient.  Motrin 800 mg po q 8 hours prn.  Reassess as needed.  IUD may be removed at any time if desired. We can go back to birth control pills or consider Depo Provera. Follow up for annual exam this summer.   __25_____ minutes face to face time of which over 50% was spent in counseling.  This includes her IUD check up and the time in counseling following the pelvic ultrasound.  After visit summary to patient.

## 2016-02-26 NOTE — Progress Notes (Signed)
Patient ID: Sierra Mccullough, female   DOB: 1991/09/02, 25 y.o.   MRN: [id number] GYNECOLOGY  VISIT   HPI: 25 y.o.   Single  Caucasian  female   G0P0 with No LMP recorded. Patient is not currently having periods (Reason: IUD).   here for 23 week follow up after Mirena IUD placement.   Has had some heavy bleeding and clotting after IUD placement.  Now cramping every day for 3 weeks.  Taking Motrin 800 mg.  Bleeding is lighter now.   Feels more tired.   Not sexually active since IUD placed. GC/CT negative each in Nov. 2016.  GYNECOLOGIC HISTORY: No LMP recorded. Patient is not currently having periods (Reason: IUD). Contraception:  Mirena IUD inserted 01-22-16 Menopausal hormone therapy:  n/a Last mammogram:  n/a Last pap smear:   06-06-15 Neg        OB History    Gravida Para Term Preterm AB TAB SAB Ectopic Multiple Living   0 0                 Patient Active Problem List   Diagnosis Date Noted  . Abnormal EKG 05/05/2015  . Palpitation 05/05/2015  . Right cervical radiculopathy 09/09/2014  . History of nephrolithiasis 11/10/2011  . Menorrhagia 11/10/2011    Past Medical History  Diagnosis Date  . Nephrolithiasis   . Dyspareunia   . Migraines   . Liver mass     focal nodular hyperplasia    Past Surgical History  Procedure Laterality Date  . Urethral stent  2011    due to kidney stones  . Laparoscopic ovarian cystectomy Right 12/23/2015    Procedure: LAPAROSCOPIC OVARIAN CYSTECTOMY WITH PERITONEAL BIOPSY;  Surgeon: Nunzio Cobbs, MD;  Location: [facility];  Service: Gynecology;  Laterality: Right;  . Wart fulguration N/A 12/23/2015    Procedure: FULGUATION ENDOMETRIOSIS;  Surgeon: Nunzio Cobbs, MD;  Location: [facility];  Service: Gynecology;  Laterality: N/A;  . Cystoscopy  12/23/2015    Procedure: CYSTOSCOPY;  Surgeon: Nunzio Cobbs, MD;  Location: [facility];  Service: Gynecology;;    Current Outpatient Prescriptions  Medication Sig Dispense  Refill  . levonorgestrel (MIRENA) 20 MCG/24HR IUD 1 each by Intrauterine route once.     No current facility-administered medications for this visit.     ALLERGIES: Keflex and Sulfonamide derivatives  Family History  Problem Relation Age of Onset  . Hypertension Mother   . Diabetes Mother   . Migraines Mother   . Diabetes Maternal Grandfather   . Heart disease Paternal Grandfather     Heart valved disease, died of MI age 69  . Heart disease Father     "ventricular tachycardia"  treated with beta blockers.   . Heart disease Paternal Uncle 28    ICD, MI  . Heart disease Paternal Aunt     Needs valve replacment CHF    Social History   Social History  . Marital Status: Single    Spouse Name: N/A  . Number of Children: N/A  . Years of Education: N/A   Occupational History  . Not on file.   Social History Main Topics  . Smoking status: Never Smoker   . Smokeless tobacco: Never Used  . Alcohol Use: No  . Drug Use: No  . Sexual Activity:    Partners: Male    Birth Control/ Protection: Abstinence, IUD     Comment: Mirena IUD inserted 01-22-16   Other Topics  Concern  . Not on file   Social History Narrative   Born in [place].  Mother is Writer and father is in Architect.  Lives with mother Sierra Mccullough Patient and sister Sierra Mccullough.   Resp therapy student.    ROS:  Pertinent items are noted in HPI.  PHYSICAL EXAMINATION:    BP 108/70 mmHg  Pulse 66  Ht 5\' 4"  (1.626 m)  Wt 148 lb 12.8 oz (67.495 kg)  BMI 25.53 kg/m2  LMP     General appearance: alert, cooperative and appears stated age  Pelvic: External genitalia:  no lesions              Urethra:  normal appearing urethra with no masses, tenderness or lesions              Bartholins and Skenes: normal                 Vagina: normal appearing vagina with normal color and discharge, no lesions              Cervix: no lesions and IUD strings in canal.  Thick white mucous coming from os.              Bimanual Exam:  Uterus:  normal size, contour, position, consistency, mobility, non-tender              Adnexa: normal adnexa and no mass, fullness, tenderness                Chaperone was present for exam.  ASSESSMENT  Mirena IUD patient.  Irregular bleeding.  Cramping.  Hx endometriosis.  Status post laparoscopy. Fatigue. Hx focal nodular hyperplasia of liver.  PLAN  Check Hgb now =  11.9. Pelvic ultrasound this afternoon.   __25_____ minutes face to face time of which over 50% was spent in counseling.  This includes her IUD check up and the time in counseling following the pelvic ultrasound.    An After Visit Summary was printed.

## 2016-06-02 ENCOUNTER — Emergency Department (HOSPITAL_BASED_OUTPATIENT_CLINIC_OR_DEPARTMENT_OTHER)
Admission: EM | Admit: 2016-06-02 | Discharge: 2016-06-03 | Disposition: A | Discharge status: Home / Self Care | Payer: BLUE CROSS/BLUE SHIELD | Attending: Emergency Medicine | Admitting: Emergency Medicine

## 2016-06-02 DIAGNOSIS — N921 Excessive and frequent menstruation with irregular cycle: Secondary | ICD-10-CM

## 2016-06-02 DIAGNOSIS — R1032 Left lower quadrant pain: Secondary | ICD-10-CM

## 2016-06-02 DIAGNOSIS — Z8742 Personal history of other diseases of the female genital tract: Secondary | ICD-10-CM

## 2016-06-02 DIAGNOSIS — N920 Excessive and frequent menstruation with regular cycle: Secondary | ICD-10-CM | POA: Diagnosis not present

## 2016-06-02 LAB — URINALYSIS, ROUTINE W REFLEX MICROSCOPIC
Bilirubin Urine: NEGATIVE
GLUCOSE, UA: NEGATIVE mg/dL
Ketones, ur: NEGATIVE mg/dL
LEUKOCYTES UA: NEGATIVE
NITRITE: NEGATIVE
PH: 6 (ref 5.0–8.0)
Protein, ur: NEGATIVE mg/dL
SPECIFIC GRAVITY, URINE: 1.01 (ref 1.005–1.030)

## 2016-06-02 LAB — URINE MICROSCOPIC-ADD ON: WBC, UA: NONE SEEN WBC/hpf (ref 0–5)

## 2016-06-02 LAB — CBC WITH DIFFERENTIAL/PLATELET
Basophils Absolute: 0 10*3/uL (ref 0.0–0.1)
Basophils Relative: 0 %
EOS PCT: 1 %
Eosinophils Absolute: 0.1 10*3/uL (ref 0.0–0.7)
HEMATOCRIT: 36.1 % (ref 36.0–46.0)
Hemoglobin: 12 g/dL (ref 12.0–15.0)
LYMPHS ABS: 3.1 10*3/uL (ref 0.7–4.0)
LYMPHS PCT: 33 %
MCH: 27.5 pg (ref 26.0–34.0)
MCHC: 33.2 g/dL (ref 30.0–36.0)
MCV: 82.6 fL (ref 78.0–100.0)
MONO ABS: 0.8 10*3/uL (ref 0.1–1.0)
Monocytes Relative: 8 %
NEUTROS ABS: 5.5 10*3/uL (ref 1.7–7.7)
Neutrophils Relative %: 58 %
PLATELETS: 298 10*3/uL (ref 150–400)
RBC: 4.37 MIL/uL (ref 3.87–5.11)
RDW: 13.5 % (ref 11.5–15.5)
WBC: 9.5 10*3/uL (ref 4.0–10.5)

## 2016-06-02 LAB — BASIC METABOLIC PANEL
Anion gap: 8 (ref 5–15)
BUN: 14 mg/dL (ref 6–20)
CO2: 25 mmol/L (ref 22–32)
Calcium: 9.3 mg/dL (ref 8.9–10.3)
Chloride: 106 mmol/L (ref 101–111)
Creatinine, Ser: 0.67 mg/dL (ref 0.44–1.00)
GFR calc Af Amer: >60 mL/min (ref >60)
GLUCOSE: 90 mg/dL (ref 65–99)
POTASSIUM: 3.5 mmol/L (ref 3.5–5.1)
Sodium: 139 mmol/L (ref 135–145)

## 2016-06-02 LAB — PREGNANCY, URINE: Preg Test, Ur: NEGATIVE

## 2016-06-02 MED ORDER — FENTANYL CITRATE (PF) 100 MCG/2ML IJ SOLN
50.0000 ug | Freq: Once | INTRAMUSCULAR | Status: AC
  Administered 2016-06-02: 50 ug via INTRAVENOUS
  Filled 2016-06-02: qty 2

## 2016-06-02 MED ORDER — HYDROCODONE-ACETAMINOPHEN 5-325 MG PO TABS: 1.0000 | ORAL_TABLET | Freq: Four times a day (QID) | ORAL | Status: DC | PRN

## 2016-06-02 MED ORDER — HYDROCODONE-ACETAMINOPHEN 5-325 MG PO TABS
1.0000 | ORAL_TABLET | Freq: Once | ORAL | Status: AC
  Administered 2016-06-02: 1 via ORAL
  Filled 2016-06-02: qty 1

## 2016-06-02 NOTE — Discharge Instructions (Signed)
Your abdominal pain and abnormal vaginal bleeding is likely related to your ovarian cysts. Use home ibuprofen as directed for symptoms, and use norco as directed as needed for additional relief but don't drive while taking norco. Follow up with your OBGYN in 5-7 days for ongoing management of your symptoms. [facility] as needed for changes or worsening symptoms.  Abdominal (belly) pain can be caused by many things. Your caregiver performed an examination and possibly ordered blood/urine tests and imaging (CT scan, x-rays, ultrasound). Many cases can be observed and treated at home after initial evaluation in the emergency department. Even though you are being discharged home, abdominal pain can be unpredictable. Therefore, you need a repeated exam if your pain does not resolve, returns, or worsens. Most patients with abdominal pain don't have to be admitted to the hospital or have surgery, but serious problems like appendicitis and gallbladder attacks can start out as nonspecific pain. Many abdominal conditions cannot be diagnosed in one visit, so follow-up evaluations are very important. SEEK IMMEDIATE MEDICAL ATTENTION IF YOU DEVELOP ANY OF THE FOLLOWING SYMPTOMS:  The pain does not go away or becomes severe.   A temperature above 101 develops.   Repeated vomiting occurs (multiple episodes).   The pain becomes localized to portions of the abdomen. The right side could possibly be appendicitis. In an adult, the left lower portion of the abdomen could be colitis or diverticulitis.   Blood is being passed in stools or vomit (bright red or black tarry stools).   Return also if you develop chest pain, difficulty breathing, dizziness or fainting, or become confused, poorly responsive, or inconsolable (young children).  The constipation stays for more than 4 days.   There is belly (abdominal) or rectal pain.   You do not seem to be getting better.     Dysfunctional Uterine  Bleeding Dysfunctional uterine bleeding is abnormal bleeding from the uterus. Dysfunctional uterine bleeding includes:  A period that comes earlier or later than usual.  A period that is lighter, heavier, or has blood clots.  Bleeding between periods.  Skipping one or more periods.  Bleeding after sexual intercourse.  Bleeding after menopause. HOME CARE INSTRUCTIONS  Pay attention to any changes in your symptoms. Follow these instructions to help with your condition: Eating  Eat well-balanced meals. Include foods that are high in iron, such as liver, meat, shellfish, green leafy vegetables, and eggs.  If you become constipated:  Drink plenty of water.  Eat fruits and vegetables that are high in water and fiber, such as spinach, carrots, raspberries, apples, and mango. Medicines  Take over-the-counter and prescription medicines only as told by your health care provider.  Do not change medicines without talking with your health care provider.  Aspirin or medicines that contain aspirin may make the bleeding worse. Do not take those medicines:  During the week before your period.  During your period.  If you were prescribed iron pills, take them as told by your health care provider. Iron pills help to replace iron that your body loses because of this condition. Activity  If you need to change your sanitary pad or tampon more than one time every 2 hours:  Lie in bed with your feet raised (elevated).  Place a cold pack on your lower abdomen.  Rest as much as possible until the bleeding stops or slows down.  Do not try to lose weight until the bleeding has stopped and your blood iron level is  back to normal. Other Instructions  For two months, write down:  When your period starts.  When your period ends.  When any abnormal bleeding occurs.  What problems you notice.  Keep all follow up visits as told by your health care provider. This is important. SEEK MEDICAL  CARE IF:  You get light-headed or weak.  You have nausea and vomiting.  You cannot eat or drink without vomiting.  You feel dizzy or have diarrhea while you are taking medicines.  You are taking birth control pills or hormones, and you want to change them or stop taking them. SEEK IMMEDIATE MEDICAL CARE IF:  You develop a fever or chills.  You need to change your sanitary pad or tampon more than one time per hour.  Your bleeding becomes heavier, or your flow contains clots more often.  You develop pain in your abdomen.  You lose consciousness.  You develop a rash.   This information is not intended to replace advice given to you by your health care provider. Make sure you discuss any questions you have with your health care provider.   Document Released: 11/12/2000 Document Revised: 08/06/2015 Document Reviewed: 02/10/2015 Elsevier Interactive Patient Education 2016 [employer]  Abdominal Pain, Adult Many things can cause belly (abdominal) pain. Most times, the belly pain is not dangerous. Many cases of belly pain can be watched and treated at home. HOME CARE   Do not take medicines that help you go poop (laxatives) unless told to by your doctor.  Only take medicine as told by your doctor.  Eat or drink as told by your doctor. Your doctor will tell you if you should be on a special diet. GET HELP IF:  You do not know what is causing your belly pain.  You have belly pain while you are sick to your stomach (nauseous) or have runny poop (diarrhea).  You have pain while you pee or poop.  Your belly pain wakes you up at night.  You have belly pain that gets worse or better when you eat.  You have belly pain that gets worse when you eat fatty foods.  You have a fever. GET HELP RIGHT AWAY IF:   The pain does not go away within 2 hours.  You keep throwing up (vomiting).  The pain changes and is only in the right or left part of the belly.  You have bloody or  tarry looking poop. MAKE SURE YOU:   Understand these instructions.  Will watch your condition.  Will get help right away if you are not doing well or get worse.   This information is not intended to replace advice given to you by your health care provider. Make sure you discuss any questions you have with your health care provider.   Document Released: 05/03/2008 Document Revised: 12/06/2014 Document Reviewed: 07/25/2013 Elsevier Interactive Patient Education [employer].

## 2016-06-02 NOTE — ED Notes (Signed)
abd pain off and on x several weeks, w nausea x 1 days,  Lower abd pain w urination, some bleeding today stringy w some clotts,  States not normal period

## 2016-06-02 NOTE — ED Provider Notes (Signed)
CSN: [id number]     Arrival date & time 06/02/16  2144 History  By signing my name below, I, Sierra Mccullough, attest that this documentation has been prepared under the direction and in the presence of [address], [employer]. Electronically Signed: Eustaquio Mccullough, ED Scribe. 06/02/2016. 11:26 PM.   Chief Complaint  Patient presents with  . Abdominal Pain   Patient is a 25 y.o. female presenting with abdominal pain. The history is provided by the patient and medical records. No language interpreter was used.  Abdominal Pain Pain location:  LLQ Pain quality: sharp   Pain radiates to:  Groin and back Pain severity:  Mild Onset quality:  Gradual Duration:  4 weeks Timing:  Intermittent Progression:  Waxing and waning Chronicity:  Recurrent Context: not recent illness, not recent sexual activity, not recent travel, not sick contacts and not suspicious food intake   Relieved by:  Nothing Worsened by:  Urination Ineffective treatments:  NSAIDs Associated symptoms: vaginal bleeding   Associated symptoms: no chest pain, no chills, no constipation, no diarrhea, no dysuria, no fever, no flatus, no hematemesis, no hematochezia, no hematuria, no melena, no nausea, no shortness of breath, no vaginal discharge and no vomiting   Risk factors: no alcohol abuse, has not had multiple surgeries and no NSAID use     HPI Comments: Sierra Mccullough is a 25 y.o. female with a PMHx of nephrolithiasis, dyspareunia, focal nodular hyperplasia of the liver, ovarian cysts, and endometriosis, who presents to the Emergency Department complaining of gradual onset, intermittent, 2/10, sharp, LLQ abdominal pain radiating to left lower back and left groin x 4 weeks, worsening today. The pain is a 10/10 at its most severe and is exacerbated while urinating. Pt has been taking 800 mg Ibuprofen without relief. Pt has been having intermittent pain since having an IUD placed in March 2017 and states this pain feels similar to  previous ovarian cyst pain. She has followed up with her OBGYN concerning the pain since having IUD placed, and had an U/S in March 2017 which showed these cysts so she was told to take ibuprofen for this issue. She mentions that she began having vaginal bleeding which started as spotting 3 days ago and then today she began passing thick dark red blood clots/slough with slightly heavier menstrual flow today. States this menses is somewhat similar to prior other than the dark slough that she's having. Pt is not sexually active, no sexual activity in >36yr. No recent travel or recent sick contact. No recent suspicious food intake. No recent EtOH use or chronic NSAID usage. She denies fever, chills, chest pain, shortness of breath, nausea, vomiting, diarrhea, constipation, obstipation, hematochezia, melena, dysuria, hematuria, malodorous urine, vaginal discharge, genital sores or lesions, weakness, numbness, tingling, lightheadedness, dizziness, or any other associated symptoms. Her OBGYN is Dr. Quincy Simmonds.   Past Medical History  Diagnosis Date  . Nephrolithiasis   . Dyspareunia   . Migraines   . Liver mass     focal nodular hyperplasia   Past Surgical History  Procedure Laterality Date  . Urethral stent  2011    due to kidney stones  . Laparoscopic ovarian cystectomy Right 12/23/2015    Procedure: LAPAROSCOPIC OVARIAN CYSTECTOMY WITH PERITONEAL BIOPSY;  Surgeon: Nunzio Cobbs, MD;  Location: [facility];  Service: Gynecology;  Laterality: Right;  . Wart fulguration N/A 12/23/2015    Procedure: FULGUATION ENDOMETRIOSIS;  Surgeon: Nunzio Cobbs, MD;  Location: [facility];  Service: Gynecology;  Laterality: N/A;  . Cystoscopy  12/23/2015    Procedure: CYSTOSCOPY;  Surgeon: Nunzio Cobbs, MD;  Location: [facility];  Service: Gynecology;;   Family History  Problem Relation Age of Onset  . Hypertension Mother   . Diabetes Mother   . Migraines Mother   . Diabetes Maternal Grandfather    . Heart disease Paternal Grandfather     Heart valved disease, died of MI age 84  . Heart disease Father     "ventricular tachycardia"  treated with beta blockers.   . Heart disease Paternal Uncle 49    ICD, MI  . Heart disease Paternal Aunt     Needs valve replacment CHF   Social History  Substance Use Topics  . Smoking status: Never Smoker   . Smokeless tobacco: Never Used  . Alcohol Use: No   OB History    Gravida Para Term Preterm AB TAB SAB Ectopic Multiple Living   0 0             Review of Systems  Constitutional: Negative for fever and chills.  Respiratory: Negative for shortness of breath.   Cardiovascular: Negative for chest pain.  Gastrointestinal: Positive for abdominal pain. Negative for nausea, vomiting, diarrhea, constipation, blood in stool, melena, hematochezia, flatus and hematemesis.  Genitourinary: Positive for vaginal bleeding. Negative for dysuria, hematuria, vaginal discharge and genital sores.  Musculoskeletal: Positive for back pain (radiating from abdomen).  Skin: Negative for color change.  Allergic/Immunologic: Negative for immunocompromised state.  Neurological: Negative for dizziness, weakness, light-headedness and numbness.  Psychiatric/Behavioral: Negative for confusion.  A complete 10 system review of systems was obtained and all systems are negative except as noted in the HPI and PMH.   Allergies  Keflex and Sulfonamide derivatives  Home Medications   Prior to Admission medications   Medication Sig Start Date End Date Taking? Authorizing Provider  ibuprofen (ADVIL,MOTRIN) 800 MG tablet Take 1 tablet (800 mg total) by mouth every 8 (eight) hours as needed. 02/26/16   Brook Oletta Lamas, MD  levonorgestrel (MIRENA) 20 MCG/24HR IUD 1 each by Intrauterine route once.    Historical Provider, MD   BP 126/101 mmHg  Pulse 78  Temp(Src) 98.2 F (36.8 C) (Oral)  Resp 18  SpO2 98%  LMP 06/02/2016   Physical Exam  Constitutional: She is  oriented to person, place, and time. Vital signs are normal. She appears well-developed and well-nourished.  Non-toxic appearance. No distress.  Afebrile, nontoxic, NAD  HENT:  Head: Normocephalic and atraumatic.  Mouth/Throat: Oropharynx is clear and moist and mucous membranes are normal.  Eyes: Conjunctivae and EOM are normal. Right eye exhibits no discharge. Left eye exhibits no discharge.  Neck: Normal range of motion. Neck supple.  Cardiovascular: Normal rate, regular rhythm, normal heart sounds and intact distal pulses.  Exam reveals no gallop and no friction rub.   No murmur heard. Pulmonary/Chest: Effort normal and breath sounds normal. No respiratory distress. She has no decreased breath sounds. She has no wheezes. She has no rhonchi. She has no rales.  Abdominal: Soft. Normal appearance and bowel sounds are normal. She exhibits no distension. There is tenderness in the left lower quadrant. There is no rigidity, no rebound, no guarding, no CVA tenderness, no tenderness at McBurney's point and negative Murphy's sign.    Soft, non distended +BS throughout, with very mild LLQ TTP, no r/g/r, neg murphy's, neg mcburney's, no CVA TTP  Musculoskeletal: Normal range of motion.  Neurological: She  is alert and oriented to person, place, and time. She has normal strength. No sensory deficit.  Skin: Skin is warm, dry and intact. No rash noted.  Psychiatric: She has a normal mood and affect.  Nursing note and vitals reviewed.   ED Course  Procedures (including critical care time)  DIAGNOSTIC STUDIES: Oxygen Saturation is 98% on RA, normal by my interpretation.    COORDINATION OF CARE: 11:22 PM-Discussed treatment plan which includes pain medication and follow up with OBGYN with pt at bedside and pt agreed to plan.   Labs Review Labs Reviewed  URINALYSIS, ROUTINE W REFLEX MICROSCOPIC (NOT AT [facility]) - Abnormal; Notable for the following:    Hgb urine dipstick LARGE (*)    All other  components within normal limits  URINE MICROSCOPIC-ADD ON - Abnormal; Notable for the following:    Squamous Epithelial / LPF 6-30 (*)    Bacteria, UA RARE (*)    All other components within normal limits  PREGNANCY, URINE  CBC WITH DIFFERENTIAL/PLATELET  BASIC METABOLIC PANEL    [id number] HIV, RPR, GC, CT, and Trich testing: negative   Imaging Review No results found.  02/26/2016 TVUS:   Uterus - no fibroids. EMS - 8.57 mm. IUD in canal. 6 mm mass versus clot. Ovaries - right ovary 13 mm CL cyst. Left ovary with 28 x 25 mm thin walled cyst with septation.  Free fluid - no  I have personally reviewed and evaluated these images and lab results as part of my medical decision-making.   EKG Interpretation None      MDM   Final diagnoses:  LLQ abdominal pain  Hx of ovarian cyst  Hx of endometriosis  Menorrhagia with irregular cycle    25 y.o. female here with recurrent intermittent LLQ abd pain x4wks-several months, has hx of cysts and endometriosis, states it feels similar to prior ovarian cyst pain. Sees OBGYN for same. Had mirena put in March 2017. Also has menstrual bleeding which is slightly different than her normal menses because it's darker with some slough, had spotting for several days before. On exam, very mild LLQ TTP but nonperitoneal, no flank tenderness. Afebrile and nontoxic. Labs show: CBC/BMP WNL, Upreg neg, U/A contaminated but without signs of infection, some blood which is likely vaginal contaminant. Had recent TVUS which showed b/l ovarian cysts, which is likely the cause of her symptoms. Doubt need for emergent imaging tonight, doubt ovarian torsion or acute emergent issue causing her symptoms. Discussed ibuprofen use regularly, and will give her something stronger for pain. Had STD testing recently which was neg, not sexually active, and no vaginal discharge, doubt need for repeat pelvic/STD testing today. Will have her f/up with OBGYN in 5-7 days for ongoing  management of her symptoms. I explained the diagnosis and have given explicit precautions to return to the ER including for any other new or worsening symptoms. The patient understands and accepts the medical plan as it's been dictated and I have answered their questions. Discharge instructions concerning home care and prescriptions have been given. The patient is STABLE and is discharged to home in good condition.   I personally performed the services described in this documentation, which was scribed in my presence. The recorded information has been reviewed and is accurate.  BP 126/101 mmHg  Pulse 78  Temp(Src) 98.2 F (36.8 C) (Oral)  Resp 18  SpO2 98%  LMP 06/02/2016  Meds ordered this encounter  Medications  . fentaNYL (SUBLIMAZE) injection 50 mcg  Sig:   . HYDROcodone-acetaminophen (NORCO/VICODIN) 5-325 MG per tablet 1 tablet    Sig:   . HYDROcodone-acetaminophen (NORCO) 5-325 MG tablet    Sig: Take 1 tablet by mouth every 6 (six) hours as needed for severe pain.    Dispense:  10 tablet    Refill:  0    Order Specific Question:  Supervising Provider    Answer:  Noemi Chapel [[id number]]        Yifan Auker Camprubi-Soms, PA-C 06/02/16 [id number]  Pedrohenrique Mcconville Camprubi-Soms, PA-C 06/02/16 2345  Shanon Rosser, MD 06/03/16 [id number]

## 2016-06-02 NOTE — ED Notes (Signed)
Pt reports abd on and off over the  Last several weeks worsened over the last two days. Denies emesis but admits to nausea also admits to Hx ovarian cyst. And surgical removal of Cyst Feb 2017 at [facility].

## 2016-06-03 ENCOUNTER — Ambulatory Visit (INDEPENDENT_AMBULATORY_CARE_PROVIDER_SITE_OTHER): Payer: BLUE CROSS/BLUE SHIELD | Admitting: Obstetrics and Gynecology

## 2016-06-03 ENCOUNTER — Telehealth: Payer: Self-pay | Admitting: Obstetrics and Gynecology

## 2016-06-03 ENCOUNTER — Encounter: Payer: Self-pay | Admitting: Obstetrics and Gynecology

## 2016-06-03 ENCOUNTER — Ambulatory Visit: Payer: BLUE CROSS/BLUE SHIELD

## 2016-06-03 VITALS — BP 108/60 | HR 60 | Ht 64.0 in | Wt 146.8 lb

## 2016-06-03 DIAGNOSIS — R1032 Left lower quadrant pain: Secondary | ICD-10-CM

## 2016-06-03 DIAGNOSIS — K5900 Constipation, unspecified: Secondary | ICD-10-CM | POA: Diagnosis not present

## 2016-06-03 DIAGNOSIS — R102 Pelvic and perineal pain: Secondary | ICD-10-CM

## 2016-06-03 DIAGNOSIS — N946 Dysmenorrhea, unspecified: Secondary | ICD-10-CM

## 2016-06-03 MED ORDER — HYDROCODONE-ACETAMINOPHEN 5-325 MG PO TABS: 1.0000 | ORAL_TABLET | Freq: Four times a day (QID) | ORAL | Status: DC | PRN

## 2016-06-03 NOTE — Telephone Encounter (Signed)
Patient called and said, "I was seen at the [facility] ER last night in [facility] They are pretty sure I have an ovarian cyst on my right side. I just need to know how to proceed."  Last seen 02/26/16.

## 2016-06-03 NOTE — Progress Notes (Signed)
Patient ID: Sierra Mccullough, female   DOB: Jan 03, 1991, 25 y.o.   MRN: [id number] GYNECOLOGY  VISIT   HPI: 25 y.o.   Single  Caucasian  female   G0P0 with Patient's last menstrual period was 06/02/2016.   here for left lower quadrant pain for 2 weeks which has gotten worse the past 2 days.  Pain off and on and then increased at work yesterday.  States pain was worse with urination and when she pushed to void.  No fever, shakes, or chills.  No nausea, vomiting, diarrhea.  Not sexually active.  Menses just started for the first time in 3 months.   Went to [facility] ED yesterday. Normal CBC, CMP. Negative UPT.  They treated the pain.  States she did not get the Rx for the Hydrocodone to fill at her pharmacy.  No ultrasound was done.   Unable to dip urine here today due to menses.   GYNECOLOGIC HISTORY: Patient's last menstrual period was 06/02/2016. Contraception:  Mirena IUD inserted 01-22-16 Menopausal hormone therapy:  n/a Last mammogram:  n/a Last pap smear:   06-06-15 Neg        OB History    Gravida Para Term Preterm AB TAB SAB Ectopic Multiple Living   0 0                 Patient Active Problem List   Diagnosis Date Noted  . Abnormal EKG 05/05/2015  . Palpitation 05/05/2015  . Right cervical radiculopathy 09/09/2014  . History of nephrolithiasis 11/10/2011  . Menorrhagia 11/10/2011    Past Medical History  Diagnosis Date  . Nephrolithiasis   . Dyspareunia   . Migraines   . Liver mass     focal nodular hyperplasia    Past Surgical History  Procedure Laterality Date  . Urethral stent  2011    due to kidney stones  . Laparoscopic ovarian cystectomy Right 12/23/2015    Procedure: LAPAROSCOPIC OVARIAN CYSTECTOMY WITH PERITONEAL BIOPSY;  Surgeon: Nunzio Cobbs, MD;  Location: [facility];  Service: Gynecology;  Laterality: Right;  . Wart fulguration N/A 12/23/2015    Procedure: FULGUATION ENDOMETRIOSIS;  Surgeon: Nunzio Cobbs, MD;  Location: [facility];  Service: Gynecology;  Laterality: N/A;  . Cystoscopy  12/23/2015    Procedure: CYSTOSCOPY;  Surgeon: Nunzio Cobbs, MD;  Location: [facility];  Service: Gynecology;;    Current Outpatient Prescriptions  Medication Sig Dispense Refill  . HYDROcodone-acetaminophen (NORCO) 5-325 MG tablet Take 1 tablet by mouth every 6 (six) hours as needed for severe pain. 10 tablet 0  . ibuprofen (ADVIL,MOTRIN) 800 MG tablet Take 1 tablet (800 mg total) by mouth every 8 (eight) hours as needed. 30 tablet 1  . levonorgestrel (MIRENA) 20 MCG/24HR IUD 1 each by Intrauterine route once.     No current facility-administered medications for this visit.     ALLERGIES: Keflex and Sulfonamide derivatives  Family History  Problem Relation Age of Onset  . Hypertension Mother   . Diabetes Mother   . Migraines Mother   . Diabetes Maternal Grandfather   . Heart disease Paternal Grandfather     Heart valved disease, died of MI age 52  . Heart disease Father     "ventricular tachycardia"  treated with beta blockers.   . Heart disease Paternal Uncle 7    ICD, MI  . Heart disease Paternal Aunt     Needs valve replacment CHF  Social History   Social History  . Marital Status: Single    Spouse Name: N/A  . Number of Children: N/A  . Years of Education: N/A   Occupational History  . Not on file.   Social History Main Topics  . Smoking status: Never Smoker   . Smokeless tobacco: Never Used  . Alcohol Use: No  . Drug Use: No  . Sexual Activity:    Partners: Male    Birth Control/ Protection: Abstinence, IUD     Comment: Mirena IUD inserted 01-22-16   Other Topics Concern  . Not on file   Social History Narrative   Born in [place].  Mother is Writer and father is in Architect.  Lives with mother [name] and sister [name].   Resp therapy student.    ROS:  Pertinent items are noted in HPI.  PHYSICAL EXAMINATION:    BP 108/60 mmHg  Pulse 60  Ht 5\' 4"  (1.626  m)  Wt 146 lb 12.8 oz (66.588 kg)  BMI 25.19 kg/m2  LMP 06/02/2016    General appearance: alert, cooperative and appears stated age    Pelvic: External genitalia:  no lesions              Urethra:  normal appearing urethra with no masses, tenderness or lesions              Bartholins and Skenes: normal                 Vagina: normal appearing vagina with normal color and discharge, no lesions              Cervix: no lesions and IUD strings and blood noted.   Very tender with speculum exam.                 Bimanual Exam:  Uterus:  normal size, contour, position, consistency, mobility, non-tender              Adnexa: normal adnexa and no mass, fullness, tenderness              Chaperone was present for exam.  ASSESSMENT  Pelvic pain in LLQ.   No sign of acute abdomen or PID. Mirena IUD patient.  Hx ovarian cysts and endometriosis.   PLAN  Discussion of pain and possible ruptured ovarian cyst.  Will return for pelvic ultrasound this afternoon.    ___15___ minutes face to face time of which over 50% was spent in counseling.   Addendum -   Office pelvic ultrasound: Uterus with no masses.  IUD in endometrial canal.  EMS - 1.83 mm. Small right corpus luteum cyst - 21 mm. No left ovarian cyst or mass.  Lots of stool in the colon.  Small amount of free fluid in pelvis.  Discussion of findings on ultrasound.  Ruptured ovarian cyst, versus dysmenorrhea, versus constipation.  - Rx for Vicodin to patient. - Note for her to return to work tomorrow per her request.  - Discussed Miralax, increased hydration, and increased fiber in her diet to help constipation.   ___10 additional____ minutes face to face time of which over 50% was spent in counseling.   Patient had a total visit time today of 25 minutes.   After visit summary to patient.

## 2016-06-03 NOTE — Telephone Encounter (Signed)
Spoke with patient. She c/o Left lower back pain. Has Mirena in place. History of endometriosis and irregular bleeding. Vaginal bleeding today, but not heavy.   Negative pregnancy testing in Emergency room.  Office visit today with Dr. Quincy Simmonds scheduled and patient agreeable.

## 2016-06-05 LAB — URINE CULTURE

## 2016-06-11 ENCOUNTER — Ambulatory Visit: Payer: BLUE CROSS/BLUE SHIELD | Admitting: Obstetrics and Gynecology

## 2017-03-28 ENCOUNTER — Ambulatory Visit (INDEPENDENT_AMBULATORY_CARE_PROVIDER_SITE_OTHER): Payer: BLUE CROSS/BLUE SHIELD | Admitting: Family Medicine

## 2017-03-28 ENCOUNTER — Encounter: Payer: Self-pay | Admitting: Family Medicine

## 2017-03-28 VITALS — BP 114/60 | HR 83 | Ht 64.0 in | Wt 144.0 lb

## 2017-03-28 DIAGNOSIS — B081 Molluscum contagiosum: Secondary | ICD-10-CM

## 2017-03-28 DIAGNOSIS — L245 Irritant contact dermatitis due to other chemical products: Secondary | ICD-10-CM

## 2017-03-28 MED ORDER — IMIQUIMOD 5 % EX CREA: TOPICAL_CREAM | CUTANEOUS | 1 refills | Status: DC

## 2017-03-28 NOTE — Patient Instructions (Addendum)
Cryosurgery for Skin Conditions Cryosurgery is when damaged skin or a skin growth is frozen and removed. It is also called cryotherapy. It usually takes a few minutes. It can be done in your doctor's office. What happens before the procedure? You do not have to do anything to get ready for this procedure. What happens during the procedure?  Your doctor may freeze your skin in one of these ways:  Putting a device (probe) on your skin. The probe has liquid nitrogen in it to keep it cold.  Swabbing or spraying liquid nitrogen onto your skin.  A bandage (dressing) may be put on the area. These procedures may vary among doctors and clinics. What happens after the procedure?  The treated area will be red and swollen. A blister may form. Summary  Cryosurgery is when damaged skin or a skin growth is removed using cold. It is also called cryotherapy.  Cryosurgery takes a few minutes. It can be done in your doctor's office.  The procedure may be done using a device (probe) or by putting liquid nitrogen on the area. This information is not intended to replace advice given to you by your health care provider. Make sure you discuss any questions you have with your health care provider. Document Released: 02/07/2012 Document Revised: 10/04/2016 Document Reviewed: 10/04/2016 Elsevier Interactive Patient Education  2017 [employer].

## 2017-03-28 NOTE — Progress Notes (Signed)
   Subjective:    Patient ID: Sierra Mccullough, female    DOB: Jul 20, 1991, 26 y.o.   MRN: [id number]  HPI 26 year old female comes in today with a rash on her legs. She says they are actually little bumps.Says her boyfriend had the same bumps about 2 months ago. He finally went to the doctor and was told that it was molluscum contagiosum. She now has similar bumps on both inner thighs. She's been trying to keep them covered. She's been using Neosporin and she's also recently tried apple cider vinegar because she read online that that could be helpful as well.   Review of Systems      Objective:   Physical Exam  Constitutional: She is oriented to person, place, and time. She appears well-developed and well-nourished.  HENT:  Head: Normocephalic and atraumatic.  Eyes: Conjunctivae and EOM are normal.  Cardiovascular: Normal rate.   Pulmonary/Chest: Effort normal.  Neurological: She is alert and oriented to person, place, and time.  Skin: Skin is dry. No pallor.  On both inner thighs the skin is erythematous. This actually a less ulcerated area on the left inner thigh. There is a lot of glue stuck on where she's been using bandages to cover the skin that is quite adherent to the skin.  Psychiatric: She has a normal mood and affect. Her behavior is normal.  Vitals reviewed.         Assessment & Plan:  Molluscum contagiosum-we'll treat with medical made cream. Apply 3 times a week. Apply bedtime and then wash off in the morning. If not covered by insurance or not improving then consider treatment with cryotherapy. Will past for additional STDs including gonorrhea Chlamydia HIV and RPR. Stop all over-the-counter treatments and stop covering with Band-Aids. She is actually starting to get a contact dermatitis from covering the area.

## 2017-03-29 LAB — GC/CHLAMYDIA PROBE AMP
CT PROBE, AMP APTIMA: NOT DETECTED
GC PROBE AMP APTIMA: NOT DETECTED

## 2017-03-29 LAB — HIV ANTIBODY (ROUTINE TESTING W REFLEX): HIV 1&2 Ab, 4th Generation: NONREACTIVE

## 2017-03-29 LAB — RPR

## 2017-10-11 ENCOUNTER — Ambulatory Visit: Payer: BLUE CROSS/BLUE SHIELD | Admitting: Family Medicine

## 2017-10-11 ENCOUNTER — Encounter: Payer: Self-pay | Admitting: Family Medicine

## 2017-10-11 VITALS — BP 114/68 | HR 70 | Ht 64.0 in | Wt 147.0 lb

## 2017-10-11 DIAGNOSIS — B081 Molluscum contagiosum: Secondary | ICD-10-CM

## 2017-10-11 DIAGNOSIS — D229 Melanocytic nevi, unspecified: Secondary | ICD-10-CM

## 2017-10-11 NOTE — Progress Notes (Signed)
    Subjective:    Patient ID: Sierra Mccullough, female    DOB: 11/25/1991, 26 y.o.   MRN: [id number]  HPI Molluscum contagiosum- she has 2 lesion on right inner thigh and one on the left inner thigh.  She would like definite tx.    Atypical nevus - she also has a dark mole on her left lower leg that is below the knee that she feels has been getting darker. No other symptoms.     Review of Systems     Objective:   Physical Exam  Constitutional: She is oriented to person, place, and time. She appears well-developed and well-nourished.  HENT:  Head: Normocephalic.  Neurological: She is alert and oriented to person, place, and time.  Skin: Skin is warm.  She has 2 molluscum on her right inner thigh and one on her left inner thigh.  On her left lower leg just below her knee she has a very small approximately 2-3 mm very dark almost black looking atypical nevus though it does have a nice round appearance.  Psychiatric: She has a normal mood and affect.          Assessment & Plan:  Molluscum contagiosum - cryotherapy performed. Pt tolerated well.   Atypical nevus - shave bx performed and pt tolerated well.   Cryotherapy Procedure Note  Pre-operative Diagnosis: molluscum  Post-operative Diagnosis: same  Locations: right and left inner thigh  Indications: irritation  Anesthesia: not required    Procedure Details  Patient informed of risks (permanent scarring, infection, light or dark discoloration, bleeding, infection, weakness, numbness and recurrence of the lesion) and benefits of the procedure and verbal informed consent obtained.  The areas are treated with liquid nitrogen therapy, frozen until ice ball extended 1-2 mm beyond lesion, allowed to thaw, and treated again. The patient tolerated procedure well.  The patient was instructed on post-op care, warned that there may be blister formation, redness and pain. Recommend OTC analgesia as needed for  pain.  Condition: Stable  Complications: none.  Plan: 1. Instructed to keep the area dry and covered for 24-48h and clean thereafter. 2. Warning signs of infection were reviewed.   3. Recommended that the patient use OTC acetaminophen as needed for pain.  4. Return PRN.   Shave Biopsy Procedure Note  Pre-operative Diagnosis: Dysplastic nevi  Post-operative Diagnosis: same  Locations:left lower leg over the shin towards the knee  Indications: getting darker.   Anesthesia: not required  Procedure Details  Patient informed of the risks (including bleeding and infection) and benefits of the  procedure and Verbal informed consent obtained.  The lesion and surrounding area were given a sterile prep using chlorhexidine and draped in the usual sterile fashion. A scalpel was used to shave an area of skin approximately 39mm by 42mm.  Hemostasis achieved with alumuninum chloride. Antibiotic ointment and a sterile dressing applied.  The specimen was sent for pathologic examination. The patient tolerated the procedure well.  EBL: trace  ml  Findings: Await pathology  Condition: Stable  Complications: none.  Plan: 1. Instructed to keep the wound dry and covered for 24-48h and clean thereafter. 2. Warning signs of infection were reviewed.   3. Recommended that the patient use OTC acetaminophen as needed for pain.  4. Return PRN.

## 2018-07-17 LAB — HM PAP SMEAR: HM Pap smear: HIGH

## 2018-12-08 ENCOUNTER — Telehealth: Payer: Self-pay | Admitting: Family Medicine

## 2018-12-08 NOTE — Telephone Encounter (Signed)
lvm asking pt to rtn call regarding pap smear and flu shot.Sierra Mccullough, [place]

## 2018-12-08 NOTE — Telephone Encounter (Signed)
Please call patient and see if she is going to Dr. Quincy Simmonds for her Pap smear.  Per our records it has been more than 3 years since her last one.  Please also see if she has had a flu shot this year.  I know she typically gets 1 and we just 1 to make sure that we have in our system if she did get one already and if not she is welcome to come in.

## 2018-12-13 NOTE — Telephone Encounter (Signed)
Results obtained via care everywhere.Maryruth Eve, [place] Crocker

## 2018-12-14 ENCOUNTER — Encounter: Payer: Self-pay | Admitting: Family Medicine

## 2018-12-14 NOTE — Progress Notes (Signed)
07/17/2018

## 2019-01-26 ENCOUNTER — Encounter: Payer: Self-pay | Admitting: Family Medicine

## 2019-01-26 ENCOUNTER — Ambulatory Visit (INDEPENDENT_AMBULATORY_CARE_PROVIDER_SITE_OTHER): Payer: Managed Care, Other (non HMO) | Admitting: Family Medicine

## 2019-01-26 VITALS — BP 121/66 | HR 80 | Ht 64.0 in | Wt 159.0 lb

## 2019-01-26 DIAGNOSIS — R51 Headache: Secondary | ICD-10-CM

## 2019-01-26 DIAGNOSIS — R519 Headache, unspecified: Secondary | ICD-10-CM

## 2019-01-26 DIAGNOSIS — H1032 Unspecified acute conjunctivitis, left eye: Secondary | ICD-10-CM

## 2019-01-26 MED ORDER — AZITHROMYCIN 1 % OP SOLN: OPHTHALMIC | 0 refills | Status: DC

## 2019-01-26 NOTE — Progress Notes (Signed)
Acute Office Visit  Subjective:    Patient ID: Sierra Mccullough, female    DOB: [birth year], 28 y.o.   MRN: [id number]  Chief Complaint  Patient presents with  . Conjunctivitis    sxs x 1 day she reports that this is only affecting her L eye and that that the whole L side of her face hurts. she stated that it is a red line in her eye. denies any fevers or itching. she has been using a cool towel and this has helped    HPI Patient is in today for sxs x 1 day she reports that this is only affecting her L eye and that that the whole L side of her face hurts. she stated that it is a red line in her eye. denies any fevers or itching. she has been using a cool towel and this has helped. She is a respiratory therapist.  She denies getting exposed to any chemicals or any trauma to that area of her face or eye.  She says it actually feels a little better today than it did yesterday yesterday she said it felt like there was a lot of pressure and discomfort on that left side of her face around her eye forehead and maxillary cheek area.  She never noticed any rash or swelling over the skin. No URI sxs.  No vision changes. No ST. she does wear contacts but remove those yesterday and they are the disposable type.  She has been wearing her glasses today.  Past Medical History:  Diagnosis Date  . Dyspareunia   . Liver mass    focal nodular hyperplasia  . Migraines   . Nephrolithiasis     Past Surgical History:  Procedure Laterality Date  . CYSTOSCOPY  12/23/2015   Procedure: CYSTOSCOPY;  Surgeon: Nunzio Cobbs, MD;  Location: [facility];  Service: Gynecology;;  . LAPAROSCOPIC OVARIAN CYSTECTOMY Right 12/23/2015   Procedure: LAPAROSCOPIC OVARIAN CYSTECTOMY WITH PERITONEAL BIOPSY;  Surgeon: Nunzio Cobbs, MD;  Location: [facility];  Service: Gynecology;  Laterality: Right;  . urethral stent  2011   due to kidney stones  . WART FULGURATION N/A 12/23/2015   Procedure: FULGUATION  ENDOMETRIOSIS;  Surgeon: Nunzio Cobbs, MD;  Location: [facility];  Service: Gynecology;  Laterality: N/A;    Family History  Problem Relation Age of Onset  . Hypertension Mother   . Diabetes Mother   . Migraines Mother   . Diabetes Maternal Grandfather   . Heart disease Paternal Grandfather        Heart valved disease, died of MI age 106  . Heart disease Father        "ventricular tachycardia"  treated with beta blockers.   . Heart disease Paternal Uncle 73       ICD, MI  . Heart disease Paternal Aunt        Needs valve replacment CHF    Social History   Socioeconomic History  . Marital status: Single    Spouse name: Not on file  . Number of children: Not on file  . Years of education: Not on file  . Highest education level: Not on file  Occupational History  . Not on file  Social Needs  . Financial resource strain: Not on file  . Food insecurity:    Worry: Not on file    Inability: Not on file  . Transportation needs:    Medical: Not on file  Non-medical: Not on file  Tobacco Use  . Smoking status: Never Smoker  . Smokeless tobacco: Never Used  Substance and Sexual Activity  . Alcohol use: No    Alcohol/week: 0.0 standard drinks  . Drug use: No  . Sexual activity: Not Currently    Partners: Male    Birth control/protection: Abstinence, I.U.D.    Comment: Mirena IUD inserted 01-22-16  Lifestyle  . Physical activity:    Days per week: Not on file    Minutes per session: Not on file  . Stress: Not on file  Relationships  . Social connections:    Talks on phone: Not on file    Gets together: Not on file    Attends religious service: Not on file    Active member of club or organization: Not on file    Attends meetings of clubs or organizations: Not on file    Relationship status: Not on file  . Intimate partner violence:    Fear of current or ex partner: Not on file    Emotionally abused: Not on file    Physically abused: Not on file    Forced  sexual activity: Not on file  Other Topics Concern  . Not on file  Social History Narrative   Born in [place].  Mother is Writer and father is in Architect.  Lives with mother [name] and sister [name].   Resp therapy student.    Outpatient Medications Prior to Visit  Medication Sig Dispense Refill  . levonorgestrel (MIRENA) 20 MCG/24HR IUD by Intrauterine route.    . imiquimod (ALDARA) 5 % cream Apply topically 3 (three) times a week. Apply at bedtime and wash off in AM. 12 each 1   No facility-administered medications prior to visit.     Allergies  Allergen Reactions  . Keflex [Cephalexin] Diarrhea and Other (See Comments)    Abdominal pain   . Sulfonamide Derivatives Other (See Comments)    Unknown reaction    ROS     Objective:    Physical Exam  Constitutional: She is oriented to person, place, and time. She appears well-developed and well-nourished.  HENT:  Head: Normocephalic and atraumatic.  Right Ear: External ear normal.  Left Ear: External ear normal.  Nose: Nose normal.  Mouth/Throat: Oropharynx is clear and moist.  TMs and canals are clear.   Eyes: Pupils are equal, round, and reactive to light. Conjunctivae and EOM are normal.  Sclera is injected.  No swelling of the lids.  No erythema or rash around the eye or face.  Nontender over the skin of the face and the forehead.  Neck: Neck supple. No thyromegaly present.  Cardiovascular: Normal rate, regular rhythm and normal heart sounds.  Pulmonary/Chest: Effort normal and breath sounds normal. She has no wheezes.  Lymphadenopathy:    She has no cervical adenopathy.  Neurological: She is alert and oriented to person, place, and time.  Skin: Skin is warm and dry.  Psychiatric: She has a normal mood and affect.    BP 121/66   Pulse 80   Ht 5\' 4"  (1.626 m)   Wt 159 lb (72.1 kg)   SpO2 100%   BMI 27.29 kg/m  Wt Readings from Last 3 Encounters:  01/26/19 159 lb (72.1 kg)  10/11/17  147 lb (66.7 kg)  03/28/17 144 lb (65.3 kg)    There are no preventive care reminders to display for this patient.  There are no preventive care reminders to display  for this patient.   Lab Results  Component Value Date   TSH 0.571 12/29/2015   Lab Results  Component Value Date   WBC 9.5 06/02/2016   HGB 12.0 06/02/2016   HCT 36.1 06/02/2016   MCV 82.6 06/02/2016   PLT 298 06/02/2016   Lab Results  Component Value Date   NA 139 06/02/2016   K 3.5 06/02/2016   CO2 25 06/02/2016   GLUCOSE 90 06/02/2016   BUN 14 06/02/2016   CREATININE 0.67 06/02/2016   BILITOT 0.3 12/29/2015   ALKPHOS 69 12/29/2015   AST 21 12/29/2015   ALT 32 (H) 12/29/2015   PROT 7.0 12/29/2015   ALBUMIN 4.4 12/29/2015   CALCIUM 9.3 06/02/2016   ANIONGAP 8 06/02/2016   No results found for: CHOL No results found for: HDL No results found for: LDLCALC No results found for: TRIG No results found for: CHOLHDL Lab Results  Component Value Date   HGBA1C 5.5 09/21/2010       Assessment & Plan:   Problem List Items Addressed This Visit    None    Visit Diagnoses    Acute conjunctivitis of left eye, unspecified acute conjunctivitis type    -  Primary   Face pain         Conjunctivitis of left eye-she does have injected sclera which is most consistent with conjunctivitis.  She had noticed a little bit of discharge yesterday but nothing today.  The only thing that is little unusual that she is also having some facial pain with it.  So the eye improves with the facial pain does not then please call me back it could be something related to her sinuses.  Ear and throat exam is normal and she is not had any other upper respiratory symptoms.  If she develops a fever redness rash or swelling over her face then please call us back immediately.  But currently no evidence of cellulitis.  Meds ordered this encounter  Medications  . azithromycin (AZASITE) 1 % ophthalmic solution    Sig: 1 drop into left eye  BID x 2 days and then QD x 5 days.    Dispense:  2.5 mL    Refill:  0     Beatrice Lecher, MD

## 2019-05-23 ENCOUNTER — Ambulatory Visit (INDEPENDENT_AMBULATORY_CARE_PROVIDER_SITE_OTHER): Payer: Managed Care, Other (non HMO)

## 2019-05-23 ENCOUNTER — Other Ambulatory Visit: Payer: Self-pay

## 2019-05-23 ENCOUNTER — Encounter: Payer: Self-pay | Admitting: Physician Assistant

## 2019-05-23 ENCOUNTER — Ambulatory Visit (INDEPENDENT_AMBULATORY_CARE_PROVIDER_SITE_OTHER): Payer: Managed Care, Other (non HMO) | Admitting: Physician Assistant

## 2019-05-23 VITALS — BP 137/77 | HR 72 | Ht 64.0 in | Wt 151.0 lb

## 2019-05-23 DIAGNOSIS — F418 Other specified anxiety disorders: Secondary | ICD-10-CM | POA: Diagnosis not present

## 2019-05-23 DIAGNOSIS — R221 Localized swelling, mass and lump, neck: Secondary | ICD-10-CM | POA: Diagnosis not present

## 2019-05-23 DIAGNOSIS — R4589 Other symptoms and signs involving emotional state: Secondary | ICD-10-CM | POA: Insufficient documentation

## 2019-05-23 DIAGNOSIS — R1310 Dysphagia, unspecified: Secondary | ICD-10-CM | POA: Insufficient documentation

## 2019-05-23 DIAGNOSIS — E041 Nontoxic single thyroid nodule: Secondary | ICD-10-CM | POA: Insufficient documentation

## 2019-05-23 MED ORDER — CLONAZEPAM 0.5 MG PO TABS: 0.5000 mg | ORAL_TABLET | Freq: Two times a day (BID) | ORAL | 0 refills | Status: DC | PRN

## 2019-05-23 NOTE — Progress Notes (Signed)
Can we please find out if interventional radiology at [facility] will do this? And how to place order?

## 2019-05-23 NOTE — Progress Notes (Signed)
GREAT news. 26mm cyst of left thyroid. We can do nothing but if causing your trouble swallowing then we should have it aspirated.   Will correlate with TSH when get labs.

## 2019-05-23 NOTE — Progress Notes (Signed)
   Subjective:    Patient ID: Sierra Mccullough, female    DOB: 1991/08/18, 28 y.o.   MRN: [id number]  HPI  Pt is a 28 yo female who presents to the clinic with left lower neck mass that she noticed on Saturday, 5 days ago. She is concerned and very emotional. It does not hurt. No ST, headache, sinus pressure, ear pain. She denies any night sweats, fevers, or weight loss. No family hx of lymphoma. She feels like it is pushing on her throat and causing her to have problems swallowing. She has not choked.   .. Active Ambulatory Problems    Diagnosis Date Noted  . History of nephrolithiasis 11/10/2011  . Menorrhagia 11/10/2011  . Right cervical radiculopathy 09/09/2014  . Abnormal EKG 05/05/2015  . Palpitation 05/05/2015  . Anxiety about health 05/23/2019  . Dysphagia 05/23/2019  . Neck mass 05/23/2019   Resolved Ambulatory Problems    Diagnosis Date Noted  . SYNCOPE 09/21/2010  . ABSCESS, SKIN 01/29/2011  . Left shoulder pain 09/07/2013  . Acute pharyngitis 06/25/2015   Past Medical History:  Diagnosis Date  . Dyspareunia   . Liver mass   . Migraines   . Nephrolithiasis       Review of Systems    see HPI.  Objective:   Physical Exam Vitals signs reviewed.  Constitutional:      Appearance: Normal appearance.  HENT:     Head: Normocephalic.     Right Ear: Tympanic membrane normal.     Left Ear: Tympanic membrane normal.     Nose: Nose normal.     Mouth/Throat:     Mouth: Mucous membranes are moist.  Eyes:     Conjunctiva/sclera: Conjunctivae normal.     Pupils: Pupils are equal, round, and reactive to light.  Neck:     Musculoskeletal: Normal range of motion.     Comments: Firm non tender 2inch by 1inch mass at the supraclavicular notch on left side. Does not appear to be mobile.able to be seen by visual inspection as well. No warmth.   Cardiovascular:     Rate and Rhythm: Normal rate and regular rhythm.  Pulmonary:     Effort: Pulmonary effort is normal.   Neurological:     Mental Status: She is alert.           Assessment & Plan:  Marland KitchenMarland KitchenLillion was seen today for mass.  Diagnoses and all orders for this visit:  Neck mass -     US SOFT TISSUE HEAD & NECK (NON-THYROID) -     TSH -     CBC with Differential/Platelet -     COMPLETE METABOLIC PANEL WITH GFR  Dysphagia, unspecified type -     US SOFT TISSUE HEAD & NECK (NON-THYROID) -     TSH  Anxiety about health -     clonazePAM (KLONOPIN) 0.5 MG tablet; Take 1 tablet (0.5 mg total) by mouth 2 (two) times daily as needed for anxiety.   Unclear etiology lymphoma, thyroid cyst/goiter, reactive lymph node. Will get stat u/s of neck. Cbc, cmp, tsh ordered today. I do not see any signs of infection.   Given klonapin to use as needed through diagnostic process. She is aware of dependency risk.

## 2019-05-24 ENCOUNTER — Other Ambulatory Visit: Payer: Self-pay | Admitting: Neurology

## 2019-05-24 DIAGNOSIS — R1310 Dysphagia, unspecified: Secondary | ICD-10-CM

## 2019-05-24 DIAGNOSIS — R221 Localized swelling, mass and lump, neck: Secondary | ICD-10-CM

## 2019-05-24 LAB — COMPLETE METABOLIC PANEL WITH GFR
AG Ratio: 2 (calc) (ref 1.0–2.5)
ALT: 20 U/L (ref 6–29)
AST: 19 U/L (ref 10–30)
Albumin: 4.9 g/dL (ref 3.6–5.1)
Alkaline phosphatase (APISO): 61 U/L (ref 31–125)
BUN: 11 mg/dL (ref 7–25)
CO2: 26 mmol/L (ref 20–32)
Calcium: 9.9 mg/dL (ref 8.6–10.2)
Chloride: 102 mmol/L (ref 98–110)
Creat: 0.77 mg/dL (ref 0.50–1.10)
GFR, Est African American: 124 mL/min/{1.73_m2} (ref >=60)
GFR, Est Non African American: 107 mL/min/{1.73_m2} (ref >=60)
Globulin: 2.4 g/dL (calc) (ref 1.9–3.7)
Glucose, Bld: 94 mg/dL (ref 65–99)
Potassium: 4 mmol/L (ref 3.5–5.3)
Sodium: 138 mmol/L (ref 135–146)
Total Bilirubin: 0.4 mg/dL (ref 0.2–1.2)
Total Protein: 7.3 g/dL (ref 6.1–8.1)

## 2019-05-24 LAB — CBC WITH DIFFERENTIAL/PLATELET
Absolute Monocytes: 723 cells/uL (ref 200–950)
Basophils Absolute: 34 cells/uL (ref 0–200)
Basophils Relative: 0.4 %
Eosinophils Absolute: 60 cells/uL (ref 15–500)
Eosinophils Relative: 0.7 %
HCT: 40.8 % (ref 35.0–45.0)
Hemoglobin: 13.2 g/dL (ref 11.7–15.5)
Lymphs Abs: 2329 cells/uL (ref 850–3900)
MCH: 27 pg (ref 27.0–33.0)
MCHC: 32.4 g/dL (ref 32.0–36.0)
MCV: 83.4 fL (ref 80.0–100.0)
MPV: 9.9 fL (ref 7.5–12.5)
Monocytes Relative: 8.5 %
Neutro Abs: 5355 cells/uL (ref 1500–7800)
Neutrophils Relative %: 63 %
Platelets: 333 10*3/uL (ref 140–400)
RBC: 4.89 10*6/uL (ref 3.80–5.10)
RDW: 12.6 % (ref 11.0–15.0)
Total Lymphocyte: 27.4 %
WBC: 8.5 10*3/uL (ref 3.8–10.8)

## 2019-05-24 LAB — TSH: TSH: 2.85 mIU/L

## 2019-05-24 NOTE — Progress Notes (Signed)
Thyroid looks good. Labs look great.

## 2019-05-24 NOTE — Addendum Note (Signed)
Addended byAnnamaria Helling on: 05/24/2019 10:56 AM   Modules accepted: Orders

## 2019-05-25 ENCOUNTER — Ambulatory Visit: Payer: Managed Care, Other (non HMO)

## 2019-05-25 ENCOUNTER — Other Ambulatory Visit: Payer: Self-pay | Admitting: Neurology

## 2019-05-25 ENCOUNTER — Other Ambulatory Visit: Payer: Self-pay

## 2019-05-25 ENCOUNTER — Telehealth: Payer: Self-pay | Admitting: Neurology

## 2019-05-25 ENCOUNTER — Ambulatory Visit (INDEPENDENT_AMBULATORY_CARE_PROVIDER_SITE_OTHER): Payer: Managed Care, Other (non HMO)

## 2019-05-25 DIAGNOSIS — R1310 Dysphagia, unspecified: Secondary | ICD-10-CM

## 2019-05-25 DIAGNOSIS — R221 Localized swelling, mass and lump, neck: Secondary | ICD-10-CM

## 2019-05-25 NOTE — Progress Notes (Signed)
Thank you so much for handing this!

## 2019-05-25 NOTE — Telephone Encounter (Signed)
Patient left vm wondering what was going on with thyroid aspiration and why they were requiring another ultrasound. I called patient back and explained to her that radiology was requiring a thyroid ultrasound since the ultrasound she had done was of her neck. Referral coordinator had told me this morning to place order and she would get her scheduled.   She is scheduled for thyroid ultrasound this afternoon. It sounds like an insurance issue is why this is required. I apologized to patient. She will have ultrasound performed today and proceed from there.

## 2019-05-28 NOTE — Progress Notes (Signed)
Do I need to order CT or are they following up on this?

## 2019-05-29 ENCOUNTER — Telehealth (HOSPITAL_COMMUNITY): Payer: Self-pay

## 2019-05-29 NOTE — Progress Notes (Signed)
Thanks

## 2019-06-03 ENCOUNTER — Encounter: Payer: Self-pay | Admitting: Physician Assistant

## 2019-06-05 ENCOUNTER — Other Ambulatory Visit: Payer: Self-pay

## 2019-06-05 ENCOUNTER — Other Ambulatory Visit: Payer: Self-pay | Admitting: Physician Assistant

## 2019-06-05 ENCOUNTER — Ambulatory Visit (HOSPITAL_COMMUNITY)
Admission: RE | Admit: 2019-06-05 | Discharge: 2019-06-05 | Disposition: A | Discharge status: Home / Self Care | Payer: Managed Care, Other (non HMO) | Source: Ambulatory Visit | Attending: Physician Assistant | Admitting: Physician Assistant

## 2019-06-05 ENCOUNTER — Other Ambulatory Visit: Payer: Self-pay | Admitting: Radiology

## 2019-06-05 ENCOUNTER — Encounter (HOSPITAL_COMMUNITY): Payer: Self-pay

## 2019-06-05 DIAGNOSIS — R1310 Dysphagia, unspecified: Secondary | ICD-10-CM | POA: Diagnosis present

## 2019-06-05 DIAGNOSIS — R221 Localized swelling, mass and lump, neck: Secondary | ICD-10-CM

## 2019-06-05 LAB — CBC
HCT: 39.2 % (ref 36.0–46.0)
Hemoglobin: 12.8 g/dL (ref 12.0–15.0)
MCH: 27.2 pg (ref 26.0–34.0)
MCHC: 32.7 g/dL (ref 30.0–36.0)
MCV: 83.4 fL (ref 80.0–100.0)
Platelets: 297 10*3/uL (ref 150–400)
RBC: 4.7 MIL/uL (ref 3.87–5.11)
RDW: 12.4 % (ref 11.5–15.5)
WBC: 9 10*3/uL (ref 4.0–10.5)
nRBC: 0 % (ref 0.0–0.2)

## 2019-06-05 LAB — PROTIME-INR
INR: 0.9 (ref 0.8–1.2)
Prothrombin Time: 12.4 seconds (ref 11.4–15.2)

## 2019-06-05 LAB — PREGNANCY, URINE: Preg Test, Ur: NEGATIVE

## 2019-06-05 MED ORDER — LIDOCAINE HCL (PF) 1 % IJ SOLN
INTRAMUSCULAR | Status: AC
  Filled 2019-06-05: qty 30

## 2019-06-05 MED ORDER — FENTANYL CITRATE (PF) 100 MCG/2ML IJ SOLN
INTRAMUSCULAR | Status: AC
  Filled 2019-06-05: qty 2

## 2019-06-05 MED ORDER — MIDAZOLAM HCL 2 MG/2ML IJ SOLN
INTRAMUSCULAR | Status: AC
  Filled 2019-06-05: qty 2

## 2019-06-05 MED ORDER — MIDAZOLAM HCL 2 MG/2ML IJ SOLN
INTRAMUSCULAR | Status: AC | PRN
  Administered 2019-06-05 (×2): 0.5 mg via INTRAVENOUS
  Administered 2019-06-05: 1 mg via INTRAVENOUS

## 2019-06-05 MED ORDER — FENTANYL CITRATE (PF) 100 MCG/2ML IJ SOLN
INTRAMUSCULAR | Status: AC | PRN
  Administered 2019-06-05: 25 ug via INTRAVENOUS
  Administered 2019-06-05: 50 ug via INTRAVENOUS
  Administered 2019-06-05: 25 ug via INTRAVENOUS

## 2019-06-05 MED ORDER — SODIUM CHLORIDE 0.9 % IV SOLN: INTRAVENOUS | Status: DC

## 2019-06-05 MED ORDER — CLONAZEPAM 1 MG PO TABS: 1.0000 mg | ORAL_TABLET | Freq: Two times a day (BID) | ORAL | 0 refills | Status: DC | PRN

## 2019-06-05 NOTE — H&P (Signed)
Chief Complaint: Mccullough was seen in consultation today for left neck mass aspiration at the request of Mccullough,Sierra L  Referring Physician(s): Donella Stade  Supervising Physician: Markus Daft  Mccullough Status: [facility] - Out-pt  History of Present Illness: Sierra Mccullough is a 28 y.o. female   Felt neck mass maybe 2 weeks ago Had not noticed it before  Imaging:  CT  IMPRESSION: Positive for 36 mm diameter simple appearing cyst at the palpable area of concern, which appears to arise from the left thyroid. Simple cysts such as this do not require imaging follow up, although if symptomatic due to size then an Ultrasound-guided needle aspiration could be requested.  [place]: No discrete nodules are seen within the thyroid gland. The 3.5 x 3.1 cm cyst unilocular cyst in the left neck inferior to the thyroid is of uncertain origin; no internal flow signal or peripheral hyperemia is identified on color Doppler. IMPRESSION: 1. Normal thyroid. 2. Indeterminate origin of inferior left neck cystic lesion. Considerations include parathyroid, thymic, or exophytic thyroid cyst. CT neck with contrast versus aspiration and analysis of cyst fluid may better define its origin.  Request made for aspiration Scheduled now for same  Past Medical History:  Diagnosis Date  . Dyspareunia   . Liver mass    focal nodular hyperplasia  . Migraines   . Nephrolithiasis     Past Surgical History:  Procedure Laterality Date  . CYSTOSCOPY  12/23/2015   Procedure: CYSTOSCOPY;  Surgeon: Nunzio Cobbs, MD;  Location: [facility];  Service: Gynecology;;  . LAPAROSCOPIC OVARIAN CYSTECTOMY Right 12/23/2015   Procedure: LAPAROSCOPIC OVARIAN CYSTECTOMY WITH PERITONEAL BIOPSY;  Surgeon: Nunzio Cobbs, MD;  Location: [facility];  Service: Gynecology;  Laterality: Right;  . urethral stent  2011   due to kidney stones  . WART FULGURATION N/A 12/23/2015   Procedure: FULGUATION ENDOMETRIOSIS;   Surgeon: Nunzio Cobbs, MD;  Location: [facility];  Service: Gynecology;  Laterality: N/A;    Allergies: Keflex [cephalexin] and Sulfonamide derivatives  Medications: Prior to Admission medications   Medication Sig Start Date End Date Taking? Authorizing Provider  clonazePAM (KLONOPIN) 1 MG tablet Take 1 tablet (1 mg total) by mouth 2 (two) times daily as needed for anxiety. 06/05/19  Yes Mccullough, Sierra L, PA-C  levonorgestrel (MIRENA) 20 MCG/24HR IUD by Intrauterine route.   Yes [provider]     Family History  Problem Relation Age of Onset  . Hypertension Mother   . Diabetes Mother   . Migraines Mother   . Heart disease Father        "ventricular tachycardia"  treated with beta blockers.   . Diabetes Maternal Grandfather   . Heart disease Paternal Grandfather        Heart valved disease, died of MI age 70  . Heart disease Paternal Uncle 31       ICD, MI  . Heart disease Paternal Aunt        Needs valve replacment CHF    Social History   Socioeconomic History  . Marital status: Single    Spouse name: Not on file  . Number of children: Not on file  . Years of education: Not on file  . Highest education level: Not on file  Occupational History  . Not on file  Social Needs  . Financial resource strain: Not on file  . Food insecurity    Worry: Not on file    Inability:  Not on file  . Transportation needs    Medical: Not on file    Non-medical: Not on file  Tobacco Use  . Smoking status: Never Smoker  . Smokeless tobacco: Never Used  Substance and Sexual Activity  . Alcohol use: No    Alcohol/week: 0.0 standard drinks  . Drug use: No  . Sexual activity: Not Currently    Partners: Male    Birth control/protection: Abstinence, I.U.D.    Comment: Mirena IUD inserted 01-22-16  Lifestyle  . Physical activity    Days per week: Not on file    Minutes per session: Not on file  . Stress: Not on file  Relationships  . Social Herbalist on  phone: Not on file    Gets together: Not on file    Attends religious service: Not on file    Active member of club or organization: Not on file    Attends meetings of clubs or organizations: Not on file    Relationship status: Not on file  Other Topics Concern  . Not on file  Social History Narrative   Born in [place].  Mother is Writer and father is in Architect.  Lives with mother Sierra Mccullough and sister [name].   Resp therapy student.    Review of Systems: A 12 point ROS discussed and pertinent positives are indicated in the HPI above.  All other systems are negative.  Review of Systems  Constitutional: Negative for activity change and fatigue.  HENT: Positive for trouble swallowing. Negative for sore throat.   Respiratory: Negative for cough, choking and shortness of breath.   Cardiovascular: Negative for chest pain.  Neurological: Negative for weakness.  Psychiatric/Behavioral: Negative for behavioral problems and confusion.    Vital Signs: BP 126/90   Pulse 94   Temp 98.3 F (36.8 C) (Oral)   Ht 5\' 4"  (1.626 m)   Wt 155 lb (70.3 kg)   LMP 06/05/2019 Comment: States she is spoting now   SpO2 100%   BMI 26.61 kg/m   Physical Exam Vitals signs reviewed.  Neck:     Musculoskeletal: Normal range of motion. No neck rigidity or muscular tenderness.     Comments: Left neck mass palpable And visible Cardiovascular:     Rate and Rhythm: Normal rate and regular rhythm.     Heart sounds: Normal heart sounds.  Pulmonary:     Breath sounds: Normal breath sounds.  Abdominal:     Tenderness: There is no abdominal tenderness.  Musculoskeletal: Normal range of motion.  Skin:    General: Skin is warm and dry.  Neurological:     Mental Status: She is alert and oriented to person, place, and time.  Psychiatric:        Mood and Affect: Mood normal.        Behavior: Behavior normal.        Thought Content: Thought content normal.        Judgment: Judgment  normal.     Imaging: US Soft Tissue Head & Neck (non-thyroid)  Result Date: 05/23/2019 CLINICAL DATA:  28 year old female with palpable left neck mass located just above the clavicle, roughly 2 centimeters x 1 centimeter. EXAM: ULTRASOUND OF HEAD/NECK SOFT TISSUES TECHNIQUE: Ultrasound examination of the head and neck soft tissues was performed in the area of clinical concern. COMPARISON:  Cervical spine MRI 03/29/2015. FINDINGS: In the left anterior neck palpable area of concern there is a round simple appearing cystic mass  measuring 34 x 27 x 36 millimeters. On image 12 this appears to be inseparable from the left thyroid lobe. The superficial surface of the cyst is located about 11 millimeters below the skin surface, and does not appear to be in the midline or associated with the strap muscles. The remaining visible thyroid parenchyma and other regional soft tissues appear normal. IMPRESSION: Positive for 36 mm diameter simple appearing cyst at the palpable area of concern, which appears to arise from the left thyroid. Simple cysts such as this do not require imaging follow up, although if symptomatic due to size then an Ultrasound-guided needle aspiration could be requested. Electronically Signed   By: Genevie Ann M.D.   On: 05/23/2019 11:06   US Thyroid  Result Date: 05/25/2019 CLINICAL DATA:  Dysphagia, neck mass . cyst noted inferior thyroid on recent ultrasound EXAM: THYROID ULTRASOUND TECHNIQUE: Ultrasound examination of the thyroid gland and adjacent soft tissues was performed. COMPARISON:  05/23/2019 FINDINGS: Parenchymal Echotexture: Normal Isthmus: 0.3 cm thickness Right lobe: 3.7 x 1.4 x 1.4 cm Left lobe: 3.5 x 1 x 1.5 cm _________________________________________________________ Estimated total number of nodules >/= 1 cm: 0 Number of spongiform nodules >/=  2 cm not described below (TR1): 0 Number of mixed cystic and solid nodules >/= 1.5 cm not described below (TR2): 0  _________________________________________________________ No discrete nodules are seen within the thyroid gland. The 3.5 x 3.1 cm cyst unilocular cyst in the left neck inferior to the thyroid is of uncertain origin; no internal flow signal or peripheral hyperemia is identified on color Doppler. IMPRESSION: 1. Normal thyroid. 2. Indeterminate origin of inferior left neck cystic lesion. Considerations include parathyroid, thymic, or exophytic thyroid cyst. CT neck with contrast versus aspiration and analysis of cyst fluid may better define its origin. The above is in keeping with the ACR TI-RADS recommendations - J Am Coll Radiol 2017;14:587-595. Electronically Signed   By: Lucrezia Europe M.D.   On: 05/25/2019 16:56    Labs:  CBC: Recent Labs    05/23/19 1006 06/05/19 1109  WBC 8.5 9.0  HGB 13.2 12.8  HCT 40.8 39.2  PLT 333 297    COAGS: Recent Labs    06/05/19 1109  INR 0.9    BMP: Recent Labs    05/23/19 1006  NA 138  K 4.0  CL 102  CO2 26  GLUCOSE 94  BUN 11  CALCIUM 9.9  CREATININE 0.77  GFRNONAA 107  GFRAA 124    LIVER FUNCTION TESTS: Recent Labs    05/23/19 1006  BILITOT 0.4  AST 19  ALT 20  PROT 7.3    TUMOR MARKERS: No results for input(s): AFPTM, CEA, CA199, CHROMGRNA in the last 8760 hours.  Assessment and Plan:  Left neck mass-- cyst appearance per [place] Scheduled for left neck cyst aspiration Risks and benefits of left neck mass/cyst aspiration was discussed with the Mccullough and/or Mccullough's family including, but not limited to bleeding, infection, damage to adjacent structures or low yield requiring additional tests.  All of the questions were answered and there is agreement to proceed. Consent signed and in chart.   Thank you for this interesting consult.  I greatly enjoyed meeting ERYCA BOLTE and look forward to participating in their care.  A copy of this report was sent to the requesting provider on this date.  Electronically Signed: Lavonia Drafts, PA-C 06/05/2019, 12:37 PM   I spent a total of  30 Minutes   in face to  face in clinical consultation, greater than 50% of which was counseling/coordinating care for left neck cyst aspiration

## 2019-06-05 NOTE — Procedures (Signed)
Interventional Radiology Procedure:   Indications: Palpable left neck cyst  Procedure: US guided cyst aspiration  Findings: Removed 15 ml of clear fluid from cyst, cyst was decompressed.  Complications: None      EBL: Less than 10 ml  Plan: Discharge to home, send fluid for analysis.   Sierra Lingenfelter R. Anselm Pancoast, MD  Pager: [phone]

## 2019-06-05 NOTE — Discharge Instructions (Signed)
Needle Biopsy, Care After °These instructions tell you how to care for yourself after your procedure. Your doctor may also give you more specific instructions. Call your doctor if you have any problems or questions. °What can I expect after the procedure? °After the procedure, it is common to have: °· Soreness. °· Bruising. °· Mild pain. °Follow these instructions at home: ° °· Return to your normal activities as told by your doctor. Ask your doctor what activities are safe for you. °· Take over-the-counter and prescription medicines only as told by your doctor. °· Wash your hands with soap and water before you change your bandage (dressing). If you cannot use soap and water, use hand sanitizer. °· Follow instructions from your doctor about: °? How to take care of your puncture site. °? When and how to change your bandage. °? When to remove your bandage. °· Check your puncture site every day for signs of infection. Watch for: °? Redness, swelling, or pain. °? Fluid or blood.  °? Pus or a bad smell. °? Warmth. °· Do not take baths, swim, or use a hot tub until your doctor approves. Ask your doctor if you may take showers. You may only be allowed to take sponge baths. °· Keep all follow-up visits as told by your doctor. This is important. °Contact a doctor if you have: °· A fever. °· Redness, swelling, or pain at the puncture site, and it lasts longer than a few days. °· Fluid, blood, or pus coming from the puncture site. °· Warmth coming from the puncture site. °Get help right away if: °· You have a lot of bleeding from the puncture site. °Summary °· After the procedure, it is common to have soreness, bruising, or mild pain at the puncture site. °· Check your puncture site every day for signs of infection, such as redness, swelling, or pain. °· Get help right away if you have severe bleeding from your puncture site. °This information is not intended to replace advice given to you by your health care provider. Make  sure you discuss any questions you have with your health care provider. °Document Released: 10/28/2008 Document Revised: 11/28/2017 Document Reviewed: 11/28/2017 °Elsevier Patient Education © 2020 Elsevier Inc. ° °

## 2019-06-05 NOTE — Progress Notes (Signed)
Discharge instructions reviewed with pt and her mother (via telephone) voices understanding.

## 2019-06-10 LAB — AEROBIC/ANAEROBIC CULTURE W GRAM STAIN (SURGICAL/DEEP WOUND): Culture: NO GROWTH

## 2019-06-11 NOTE — Progress Notes (Signed)
I suspect you have been called with results but if not no abnormal cells identified in biopsy. Great news. How are you feeling?

## 2019-07-17 ENCOUNTER — Other Ambulatory Visit: Payer: Self-pay | Admitting: Physician Assistant

## 2019-07-18 MED ORDER — CLONAZEPAM 1 MG PO TABS: 1.0000 mg | ORAL_TABLET | Freq: Every day | ORAL | 0 refills | Status: DC | PRN

## 2019-07-18 NOTE — Telephone Encounter (Signed)
Please call patient and let her know that I will go ahead and refill the clonazepam but she needs to be using it very sparingly.  It can cause dependency and I would not recommend taking it daily.  If she feels like she is having a lot of anxiety on a daily basis then we really need to look at starting her on a controller and I am happy to do a virtual visit with her if she would like to discuss that.

## 2019-07-19 ENCOUNTER — Encounter: Payer: Self-pay | Admitting: Family Medicine

## 2019-07-19 NOTE — Telephone Encounter (Signed)
Called patient and sent her to scheduling to schedule an appointment. KG LPN

## 2019-07-23 ENCOUNTER — Encounter: Payer: Self-pay | Admitting: Family Medicine

## 2019-07-23 ENCOUNTER — Telehealth (INDEPENDENT_AMBULATORY_CARE_PROVIDER_SITE_OTHER): Payer: Managed Care, Other (non HMO) | Admitting: Family Medicine

## 2019-07-23 VITALS — Ht 64.0 in

## 2019-07-23 DIAGNOSIS — F321 Major depressive disorder, single episode, moderate: Secondary | ICD-10-CM

## 2019-07-23 MED ORDER — ESCITALOPRAM OXALATE 10 MG PO TABS: ORAL_TABLET | ORAL | 0 refills | Status: DC

## 2019-07-23 NOTE — Progress Notes (Signed)
Virtual Visit via Video Note  I connected with Sierra Mccullough on 07/23/19 at  8:10 AM EDT by a video enabled telemedicine application and verified that I am speaking with the correct person using two identifiers.   I discussed the limitations of evaluation and management by telemedicine and the availability of in person appointments. The patient expressed understanding and agreed to proceed.    Established Patient Office Visit  Subjective:  Patient ID: Sierra Mccullough, female    DOB: [birth year]  Age: 28 y.o. MRN: [id number]  CC:  Chief Complaint  Patient presents with  . mood    HPI Sierra Mccullough presents for mood.  She has been feeling down and depressed more recently.  No thoughts of wanting to harm herself.  But she just had a lot of stress.  Work has been really busy and she has been working overtime and seems to get caught and every time she is on backup.  She has had some issues with some friends.  More recently her father was sent to jail.  And this is been very stressful and worrisome to her.  She is worried about his safety.  And she just has mixed feelings about what happened.  She says by friends advise she did go ahead and try to call to make a therapist appointment but it is not for couple more weeks.  She has been using the clonazepam but says she has been using it a little bit more frequently and had noticed that the head ramped up significantly.  Past Medical History:  Diagnosis Date  . Dyspareunia   . Liver mass    focal nodular hyperplasia  . Migraines   . Nephrolithiasis     Past Surgical History:  Procedure Laterality Date  . CYSTOSCOPY  12/23/2015   Procedure: CYSTOSCOPY;  Surgeon: Nunzio Cobbs, MD;  Location: [facility];  Service: Gynecology;;  . LAPAROSCOPIC OVARIAN CYSTECTOMY Right 12/23/2015   Procedure: LAPAROSCOPIC OVARIAN CYSTECTOMY WITH PERITONEAL BIOPSY;  Surgeon: Nunzio Cobbs, MD;  Location: [facility];  Service: Gynecology;   Laterality: Right;  . urethral stent  2011   due to kidney stones  . WART FULGURATION N/A 12/23/2015   Procedure: FULGUATION ENDOMETRIOSIS;  Surgeon: Nunzio Cobbs, MD;  Location: [facility];  Service: Gynecology;  Laterality: N/A;    Family History  Problem Relation Age of Onset  . Hypertension Mother   . Diabetes Mother   . Migraines Mother   . Heart disease Father        "ventricular tachycardia"  treated with beta blockers.   . Diabetes Maternal Grandfather   . Heart disease Paternal Grandfather        Heart valved disease, died of MI age 72  . Heart disease Paternal Uncle 31       ICD, MI  . Heart disease Paternal Aunt        Needs valve replacment CHF    Social History   Socioeconomic History  . Marital status: Single    Spouse name: Not on file  . Number of children: Not on file  . Years of education: Not on file  . Highest education level: Not on file  Occupational History  . Not on file  Social Needs  . Financial resource strain: Not on file  . Food insecurity    Worry: Not on file    Inability: Not on file  . Transportation needs  Medical: Not on file    Non-medical: Not on file  Tobacco Use  . Smoking status: Never Smoker  . Smokeless tobacco: Never Used  Substance and Sexual Activity  . Alcohol use: No    Alcohol/week: 0.0 standard drinks  . Drug use: No  . Sexual activity: Not Currently    Partners: Male    Birth control/protection: Abstinence, I.U.D.    Comment: Mirena IUD inserted 01-22-16  Lifestyle  . Physical activity    Days per week: Not on file    Minutes per session: Not on file  . Stress: Not on file  Relationships  . Social Herbalist on phone: Not on file    Gets together: Not on file    Attends religious service: Not on file    Active member of club or organization: Not on file    Attends meetings of clubs or organizations: Not on file    Relationship status: Not on file  . Intimate partner violence    Fear  of current or ex partner: Not on file    Emotionally abused: Not on file    Physically abused: Not on file    Forced sexual activity: Not on file  Other Topics Concern  . Not on file  Social History Narrative   Born in [place].  Mother is Writer and father is in Architect.  Lives with mother [name] and sister [name].   Resp therapy student.    Outpatient Medications Prior to Visit  Medication Sig Dispense Refill  . clonazePAM (KLONOPIN) 1 MG tablet Take 1 tablet (1 mg total) by mouth daily as needed for anxiety. 30 tablet 0  . levonorgestrel (MIRENA) 20 MCG/24HR IUD by Intrauterine route.     No facility-administered medications prior to visit.     Allergies  Allergen Reactions  . Keflex [Cephalexin] Diarrhea and Other (See Comments)    Abdominal pain   . Sulfonamide Derivatives Other (See Comments)    Unknown reaction    ROS Review of Systems    Objective:    Physical Exam  Constitutional: She is oriented to person, place, and time. She appears well-developed and well-nourished.  Neurological: She is alert and oriented to person, place, and time.  Psychiatric: She has a normal mood and affect.    Ht 5\' 4"  (1.626 m)   BMI 26.61 kg/m  Wt Readings from Last 3 Encounters:  06/05/19 155 lb (70.3 kg)  05/23/19 151 lb (68.5 kg)  01/26/19 159 lb (72.1 kg)     Health Maintenance Due  Topic Date Due  . PAP SMEAR-Modifier  07/18/2019    There are no preventive care reminders to display for this patient.  Lab Results  Component Value Date   TSH 2.85 05/23/2019   Lab Results  Component Value Date   WBC 9.0 06/05/2019   HGB 12.8 06/05/2019   HCT 39.2 06/05/2019   MCV 83.4 06/05/2019   PLT 297 06/05/2019   Lab Results  Component Value Date   NA 138 05/23/2019   K 4.0 05/23/2019   CO2 26 05/23/2019   GLUCOSE 94 05/23/2019   BUN 11 05/23/2019   CREATININE 0.77 05/23/2019   BILITOT 0.4 05/23/2019   ALKPHOS 69 12/29/2015   AST 19  05/23/2019   ALT 20 05/23/2019   PROT 7.3 05/23/2019   ALBUMIN 4.4 12/29/2015   CALCIUM 9.9 05/23/2019   ANIONGAP 8 06/02/2016   No results found for: CHOL No results found  for: HDL No results found for: LDLCALC No results found for: TRIG No results found for: CHOLHDL Lab Results  Component Value Date   HGBA1C 5.5 09/21/2010      Assessment & Plan:   Problem List Items Addressed This Visit      Other   Current moderate episode of major depressive disorder without prior episode ([place]) - Primary    PHQ 9 score 14 today.  She rates her symptoms as a significant problem.  I did encourage her to continue to keep the therapy appointment. F/U in 3-4 weeks.          Relevant Medications   escitalopram (LEXAPRO) 10 MG tablet      Meds ordered this encounter  Medications  . escitalopram (LEXAPRO) 10 MG tablet    Sig: 1/2 tab once a day for 6 days then go to whole tab day    Dispense:  30 tablet    Refill:  0    Follow-up: Return in about 3 weeks (around 08/13/2019) for New start medication.    I discussed the assessment and treatment plan with the patient. The patient was provided an opportunity to ask questions and all were answered. The patient agreed with the plan and demonstrated an understanding of the instructions.   The patient was advised to call back or seek an in-person evaluation if the symptoms worsen or if the condition fails to improve as anticipated.  Time spent 25 min, > 50% spent in counseling about depression.   Beatrice Lecher, MD

## 2019-07-23 NOTE — Assessment & Plan Note (Addendum)
PHQ 9 score 14 today.  She rates her symptoms as a significant problem.  I did encourage her to continue to keep the therapy appointment. F/U in 3-4 weeks.

## 2019-07-31 ENCOUNTER — Encounter: Payer: Self-pay | Admitting: Family Medicine

## 2019-07-31 DIAGNOSIS — Z87442 Personal history of urinary calculi: Secondary | ICD-10-CM

## 2019-07-31 DIAGNOSIS — N200 Calculus of kidney: Secondary | ICD-10-CM

## 2019-07-31 NOTE — Telephone Encounter (Signed)
Referral pended,plese review and send if appropriate

## 2019-08-14 ENCOUNTER — Other Ambulatory Visit: Payer: Self-pay | Admitting: Family Medicine

## 2019-08-30 ENCOUNTER — Other Ambulatory Visit: Payer: Self-pay | Admitting: Family Medicine

## 2019-08-31 MED ORDER — ESCITALOPRAM OXALATE 10 MG PO TABS: 10.0000 mg | ORAL_TABLET | Freq: Every day | ORAL | 2 refills | Status: DC

## 2019-08-31 NOTE — Telephone Encounter (Signed)
[facility] requesting med refill for escitalopram. Attempted to contact pt to confirm whether she is taking 1/2 tab or 1 tab daily. No answer, left a vm msg for pt to return a call back. Direct call back info provided. Pls advise if appropriate to send refill. Thanks.

## 2019-09-01 MED ORDER — ONDANSETRON HCL 4 MG PO TABS
4.00 | ORAL_TABLET | ORAL | Status: DC
Start: ? — End: 2019-09-01

## 2019-09-01 MED ORDER — ACETAMINOPHEN 325 MG PO TABS
650.00 | ORAL_TABLET | ORAL | Status: DC
Start: ? — End: 2019-09-01

## 2019-09-01 MED ORDER — VITAMIN B-12 1000 MCG PO TABS
1000.00 | ORAL_TABLET | ORAL | Status: DC
Start: 2019-09-02 — End: 2019-09-01

## 2019-09-01 MED ORDER — FAMOTIDINE 20 MG PO TABS
20.00 | ORAL_TABLET | ORAL | Status: DC
Start: 2019-09-01 — End: 2019-09-01

## 2019-09-05 MED ORDER — LORAZEPAM 2 MG/ML IJ SOLN
2.00 | INTRAMUSCULAR | Status: DC
Start: ? — End: 2019-09-05

## 2019-09-05 MED ORDER — GENERIC EXTERNAL MEDICATION
5.00 | Status: DC
Start: ? — End: 2019-09-05

## 2019-09-05 MED ORDER — TRAZODONE HCL 50 MG PO TABS
50.00 | ORAL_TABLET | ORAL | Status: DC
Start: ? — End: 2019-09-05

## 2019-09-05 MED ORDER — GUAIFENESIN 100 MG/5ML PO SYRP
200.00 | ORAL_SOLUTION | ORAL | Status: DC
Start: ? — End: 2019-09-05

## 2019-09-05 MED ORDER — HYDROXYZINE HCL 25 MG PO TABS
25.00 | ORAL_TABLET | ORAL | Status: DC
Start: ? — End: 2019-09-05

## 2019-09-05 MED ORDER — BENZOCAINE-MENTHOL 6-10 MG MT LOZG
1.00 | LOZENGE | OROMUCOSAL | Status: DC
Start: ? — End: 2019-09-05

## 2019-09-05 MED ORDER — ACETAMINOPHEN 500 MG PO TABS
500.00 | ORAL_TABLET | ORAL | Status: DC
Start: ? — End: 2019-09-05

## 2019-09-05 MED ORDER — GENERIC EXTERNAL MEDICATION
15.00 | Status: DC
Start: 2019-09-06 — End: 2019-09-05

## 2019-09-05 MED ORDER — HALOPERIDOL 5 MG PO TABS
5.00 | ORAL_TABLET | ORAL | Status: DC
Start: ? — End: 2019-09-05

## 2019-09-05 MED ORDER — DOCUSATE SODIUM 100 MG PO CAPS
100.00 | ORAL_CAPSULE | ORAL | Status: DC
Start: ? — End: 2019-09-05

## 2019-09-05 MED ORDER — ALUMINUM-MAGNESIUM-SIMETHICONE 200-200-20 MG/5ML PO SUSP
30.00 | ORAL | Status: DC
Start: ? — End: 2019-09-05

## 2019-09-05 MED ORDER — LORAZEPAM 1 MG PO TABS
1.00 | ORAL_TABLET | ORAL | Status: DC
Start: ? — End: 2019-09-05

## 2019-10-11 ENCOUNTER — Telehealth: Payer: Self-pay | Admitting: Family Medicine

## 2019-10-11 NOTE — Telephone Encounter (Signed)
My chart note sent.

## 2021-01-19 ENCOUNTER — Encounter: Payer: Managed Care, Other (non HMO) | Admitting: Family Medicine

## 2021-02-16 ENCOUNTER — Other Ambulatory Visit: Payer: Self-pay | Admitting: Family Medicine

## 2021-03-23 ENCOUNTER — Telehealth: Payer: Self-pay | Admitting: General Practice

## 2021-03-23 NOTE — Telephone Encounter (Signed)
Transition Care Management Follow-up Telephone Call  Date of discharge and from where: [facility]  How have you been since you were released from the hospital? [facility]  Any questions or concerns? No

## 2021-05-08 ENCOUNTER — Telehealth: Payer: Self-pay | Admitting: Neurology

## 2021-05-08 NOTE — Telephone Encounter (Signed)
Patient called, she stepped on a nail yesterday and wanted to know when her last Tdap was done. Last done 07/2011. Called and LMOM letting patient know it has been almost 10 years and she does need a Tdap done. Encouraged her to call back and schedule a nurse visit for shot or if any issues with wound that she should schedule with a provider.   Dr. Madilyn Fireman - FYI.

## 2021-05-11 ENCOUNTER — Ambulatory Visit: Payer: Managed Care, Other (non HMO)

## 2021-05-12 ENCOUNTER — Other Ambulatory Visit: Payer: Self-pay

## 2021-05-12 ENCOUNTER — Ambulatory Visit (INDEPENDENT_AMBULATORY_CARE_PROVIDER_SITE_OTHER): Payer: PRIVATE HEALTH INSURANCE | Admitting: Physician Assistant

## 2021-05-12 VITALS — BP 111/61 | HR 86 | Resp 20 | Ht 64.0 in | Wt 152.0 lb

## 2021-05-12 DIAGNOSIS — Z23 Encounter for immunization: Secondary | ICD-10-CM | POA: Diagnosis not present

## 2021-05-12 NOTE — Progress Notes (Signed)
Agree with the above plan 

## 2021-05-12 NOTE — Progress Notes (Signed)
Established Patient Office Visit  Subjective:  Patient ID: Sierra Mccullough, female    DOB: 1991-03-23  Age: 30 y.o. MRN: [id number]  CC:  Chief Complaint  Patient presents with   Immunizations    HPI MANASVI DICKARD presents for a Tdap vaccine. Injection given in left deltoid, pt tolerated well with no apparent complications.  Past Medical History:  Diagnosis Date   Dyspareunia    Liver mass    focal nodular hyperplasia   Migraines    Nephrolithiasis     Past Surgical History:  Procedure Laterality Date   CYSTOSCOPY  12/23/2015   Procedure: CYSTOSCOPY;  Surgeon: Nunzio Cobbs, MD;  Location: [facility];  Service: Gynecology;;   LAPAROSCOPIC OVARIAN CYSTECTOMY Right 12/23/2015   Procedure: LAPAROSCOPIC OVARIAN CYSTECTOMY WITH PERITONEAL BIOPSY;  Surgeon: Nunzio Cobbs, MD;  Location: [facility];  Service: Gynecology;  Laterality: Right;   urethral stent  2011   due to kidney stones   WART FULGURATION N/A 12/23/2015   Procedure: FULGUATION ENDOMETRIOSIS;  Surgeon: Nunzio Cobbs, MD;  Location: [facility];  Service: Gynecology;  Laterality: N/A;    Family History  Problem Relation Age of Onset   Hypertension Mother    Diabetes Mother    Migraines Mother    Heart disease Father        "ventricular tachycardia"  treated with beta blockers.    Diabetes Maternal Grandfather    Heart disease Paternal Grandfather        Heart valved disease, died of MI age 24   Heart disease Paternal Uncle 20       ICD, MI   Heart disease Paternal Aunt        Needs valve replacment CHF    Social History   Socioeconomic History   Marital status: Single    Spouse name: Not on file   Number of children: Not on file   Years of education: Not on file   Highest education level: Not on file  Occupational History   Not on file  Tobacco Use   Smoking status: Never   Smokeless tobacco: Never  Substance and Sexual Activity   Alcohol use: No    Alcohol/week: 0.0  standard drinks   Drug use: No   Sexual activity: Not Currently    Partners: Male    Birth control/protection: Abstinence, I.U.D.    Comment: Mirena IUD inserted 01-22-16  Other Topics Concern   Not on file  Social History Narrative   Born in [place].  Mother is Writer and father is in Architect.  Lives with mother [name] and sister [name].   Resp therapy student.   Social Determinants of Health   Financial Resource Strain: Not on file  Food Insecurity: Not on file  Transportation Needs: Not on file  Physical Activity: Not on file  Stress: Not on file  Social Connections: Not on file  Intimate Partner Violence: Not on file    Outpatient Medications Prior to Visit  Medication Sig Dispense Refill   clonazePAM (KLONOPIN) 1 MG tablet Take 1 tablet (1 mg total) by mouth daily as needed for anxiety. 30 tablet 0   escitalopram (LEXAPRO) 10 MG tablet Take 1 tablet (10 mg total) by mouth daily. 1/2 tab once a day for 6 days then go to whole tab day 30 tablet 2   levonorgestrel (MIRENA) 20 MCG/24HR IUD by Intrauterine route.     No facility-administered medications prior  to visit.    Allergies  Allergen Reactions   Keflex [Cephalexin] Diarrhea and Other (See Comments)    Abdominal pain    Sulfonamide Derivatives Other (See Comments)    Unknown reaction    ROS Review of Systems    Objective:    Physical Exam  BP 111/61 (BP Location: Left Arm, Patient Position: Sitting, Cuff Size: Normal)   Pulse 86   Resp 20   Ht 5\' 4"  (1.626 m)   Wt 152 lb (68.9 kg)   SpO2 98%   BMI 26.09 kg/m  Wt Readings from Last 3 Encounters:  05/12/21 152 lb (68.9 kg)  06/05/19 155 lb (70.3 kg)  05/23/19 151 lb (68.5 kg)     Health Maintenance Due  Topic Date Due   Pneumococcal Vaccine 22-46 Years old (1 - PCV) Never done   PAP SMEAR-Modifier  07/18/2019   COVID-19 Vaccine (2 - Moderna series) 06/09/2020    There are no preventive care reminders to display for  this patient.  Lab Results  Component Value Date   TSH 2.85 05/23/2019   Lab Results  Component Value Date   WBC 9.0 06/05/2019   HGB 12.8 06/05/2019   HCT 39.2 06/05/2019   MCV 83.4 06/05/2019   PLT 297 06/05/2019   Lab Results  Component Value Date   NA 138 05/23/2019   K 4.0 05/23/2019   CO2 26 05/23/2019   GLUCOSE 94 05/23/2019   BUN 11 05/23/2019   CREATININE 0.77 05/23/2019   BILITOT 0.4 05/23/2019   ALKPHOS 69 12/29/2015   AST 19 05/23/2019   ALT 20 05/23/2019   PROT 7.3 05/23/2019   ALBUMIN 4.4 12/29/2015   CALCIUM 9.9 05/23/2019   ANIONGAP 8 06/02/2016   No results found for: CHOL No results found for: HDL No results found for: LDLCALC No results found for: TRIG No results found for: CHOLHDL Lab Results  Component Value Date   HGBA1C 5.5 09/21/2010      Assessment & Plan:  Injection given in left deltoid, pt tolerated well with no apparent complications. Problem List Items Addressed This Visit   None Visit Diagnoses     Need for Tdap vaccination    -  Primary   Relevant Orders   Tdap vaccine greater than or equal to 7yo IM (Completed)       No orders of the defined types were placed in this encounter.   Follow-up: No follow-ups on file.    Ninfa Meeker, CMA

## 2021-07-13 LAB — RESULTS CONSOLE HPV: CHL HPV: POSITIVE

## 2021-07-13 LAB — HM PAP SMEAR: HM Pap smear: NEGATIVE

## 2021-10-27 ENCOUNTER — Encounter: Payer: Self-pay | Admitting: Family Medicine

## 2021-10-27 ENCOUNTER — Other Ambulatory Visit: Payer: Self-pay

## 2021-10-27 ENCOUNTER — Ambulatory Visit (INDEPENDENT_AMBULATORY_CARE_PROVIDER_SITE_OTHER): Payer: PRIVATE HEALTH INSURANCE | Admitting: Family Medicine

## 2021-10-27 VITALS — BP 107/52 | HR 69 | Ht 64.0 in | Wt 145.0 lb

## 2021-10-27 DIAGNOSIS — F1021 Alcohol dependence, in remission: Secondary | ICD-10-CM | POA: Insufficient documentation

## 2021-10-27 DIAGNOSIS — Z0000 Encounter for general adult medical examination without abnormal findings: Secondary | ICD-10-CM

## 2021-10-27 NOTE — Patient Instructions (Signed)

## 2021-10-27 NOTE — Progress Notes (Signed)
Subjective:     Sierra Mccullough is a 30 y.o. female and is here for a comprehensive physical exam. The patient reports no problems.  She is doing well overall she recently separated from her husband.  And she is currently enrolled in [place] and doing well.  She is seeing psychiatry she is now on fluoxetine and as needed clonazepam and feels like things are finally getting a little bit better regulated.  Social History   Socioeconomic History   Marital status: Single    Spouse name: Not on file   Number of children: Not on file   Years of education: Not on file   Highest education level: Not on file  Occupational History   Not on file  Tobacco Use   Smoking status: Never   Smokeless tobacco: Never  Substance and Sexual Activity   Alcohol use: Not Currently   Drug use: No   Sexual activity: Not Currently    Partners: Male    Birth control/protection: Abstinence, I.U.D.  Other Topics Concern   Not on file  Social History Narrative   Born in [place].  Mother is Writer and father is in Architect.   Resp therapist   Social Determinants of Health   Financial Resource Strain: Not on file  Food Insecurity: Not on file  Transportation Needs: Not on file  Physical Activity: Not on file  Stress: Not on file  Social Connections: Not on file  Intimate Partner Violence: Not on file   Health Maintenance  Topic Date Due   COVID-19 Vaccine (2 - Moderna series) 10/27/2022 (Originally 06/09/2020)   PAP SMEAR-Modifier  07/13/2022   PAP-Cervical Cytology Screening  07/13/2024   TETANUS/TDAP  05/13/2031   INFLUENZA VACCINE  Completed   Hepatitis C Screening  Completed   HIV Screening  Completed   Pneumococcal Vaccine 61-29 Years old  Aged Out   HPV VACCINES  Aged Out    The following portions of the patient's history were reviewed and updated as appropriate: allergies, current medications, past family history, past medical history, past social history, past surgical  history, and problem list.  Review of Systems Pertinent items are noted in HPI.   Objective:    BP (!) 107/52   Pulse 69   Ht 5\' 4"  (1.626 m)   Wt 145 lb (65.8 kg)   SpO2 100%   BMI 24.89 kg/m  General appearance: alert, cooperative, and appears stated age Head: Normocephalic, without obvious abnormality, atraumatic Eyes:  conj clear, EOMI, PEERLA Ears: normal TM's and external ear canals both ears Nose: Nares normal. Septum midline. Mucosa normal. No drainage or sinus tenderness. Throat: lips, mucosa, and tongue normal; teeth and gums normal Neck: no adenopathy, no carotid bruit, no JVD, supple, symmetrical, trachea midline, and thyroid not enlarged, symmetric, no tenderness/mass/nodules Back: symmetric, no curvature. ROM normal. No CVA tenderness. Lungs: clear to auscultation bilaterally Heart: regular rate and rhythm, S1, S2 normal, no murmur, click, rub or gallop Abdomen: soft, non-tender; bowel sounds normal; no masses,  no organomegaly Extremities: extremities normal, atraumatic, no cyanosis or edema Pulses: 2+ and symmetric Skin: Skin color, texture, turgor normal. No rashes or lesions Lymph nodes: Cervical adenopathy: nl and Supraclavicular adenopathy: nl Neurologic: Grossly normal    Assessment:    Healthy female exam.      Plan:     See After Visit Summary for Counseling Recommendations  Keep up a regular exercise program and make sure you are eating a healthy diet Try  to eat 4 servings of dairy a day, or if you are lactose intolerant take a calcium with vitamin D daily.  Your vaccines are up to date.  Pap smear is up-to-date with her OB/GYN we will get that abstracted into her chart. Would like to get up-to-date blood work today have never done a screening lipid on her before.

## 2021-10-28 LAB — COMPLETE METABOLIC PANEL WITH GFR
AG Ratio: 1.8 (calc) (ref 1.0–2.5)
ALT: 16 U/L (ref 6–29)
AST: 15 U/L (ref 10–30)
Albumin: 4.6 g/dL (ref 3.6–5.1)
Alkaline phosphatase (APISO): 56 U/L (ref 31–125)
BUN: 13 mg/dL (ref 7–25)
CO2: 27 mmol/L (ref 20–32)
Calcium: 9.5 mg/dL (ref 8.6–10.2)
Chloride: 102 mmol/L (ref 98–110)
Creat: 0.81 mg/dL (ref 0.50–0.96)
Globulin: 2.6 g/dL (calc) (ref 1.9–3.7)
Glucose, Bld: 84 mg/dL (ref 65–99)
Potassium: 4.3 mmol/L (ref 3.5–5.3)
Sodium: 138 mmol/L (ref 135–146)
Total Bilirubin: 0.5 mg/dL (ref 0.2–1.2)
Total Protein: 7.2 g/dL (ref 6.1–8.1)
eGFR: 101 mL/min/{1.73_m2} (ref >=60)

## 2021-10-28 LAB — LIPID PANEL
Cholesterol: 216 mg/dL — ABNORMAL HIGH (ref <200)
HDL: 50 mg/dL (ref >=50)
LDL Cholesterol (Calc): 141 mg/dL (calc) — ABNORMAL HIGH
Non-HDL Cholesterol (Calc): 166 mg/dL (calc) — ABNORMAL HIGH (ref <130)
Total CHOL/HDL Ratio: 4.3 (calc) (ref <5.0)
Triglycerides: 130 mg/dL (ref <150)

## 2021-10-28 LAB — CBC
HCT: 38.1 % (ref 35.0–45.0)
Hemoglobin: 12.4 g/dL (ref 11.7–15.5)
MCH: 27.1 pg (ref 27.0–33.0)
MCHC: 32.5 g/dL (ref 32.0–36.0)
MCV: 83.4 fL (ref 80.0–100.0)
MPV: 9.8 fL (ref 7.5–12.5)
Platelets: 362 10*3/uL (ref 140–400)
RBC: 4.57 10*6/uL (ref 3.80–5.10)
RDW: 12.3 % (ref 11.0–15.0)
WBC: 9.2 10*3/uL (ref 3.8–10.8)

## 2021-10-28 NOTE — Progress Notes (Signed)
Hi Sierra Mccullough, Your complete metabolic panel looks great.  Total cholesterol and LDL are elevated just encourage you to continue to work on healthy diet and regular exercise.  Your complete blood count is normal no sign of anemia.

## 2022-09-09 ENCOUNTER — Ambulatory Visit (INDEPENDENT_AMBULATORY_CARE_PROVIDER_SITE_OTHER): Payer: Managed Care, Other (non HMO) | Admitting: Family Medicine

## 2022-09-09 ENCOUNTER — Encounter: Payer: Self-pay | Admitting: Family Medicine

## 2022-09-09 VITALS — BP 102/64 | HR 86 | Ht 64.0 in | Wt 166.0 lb

## 2022-09-09 DIAGNOSIS — E282 Polycystic ovarian syndrome: Secondary | ICD-10-CM | POA: Insufficient documentation

## 2022-09-09 DIAGNOSIS — N912 Amenorrhea, unspecified: Secondary | ICD-10-CM | POA: Insufficient documentation

## 2022-09-09 NOTE — Assessment & Plan Note (Addendum)
-   POC urine Pregnancy test interpreted by myself and was negative  - did get bhcg since it may be too early to tell - I wonder if the plan B is affecting her menstrual cycle and this is why she is late  - obviously a differential for amenorrhea could be pregnancy in this case others could be the use of plan b or stress

## 2022-09-09 NOTE — Progress Notes (Signed)
   Acute Office Visit  Subjective:     Patient ID: Sierra Mccullough, female    DOB: 29-Sep-1991, 29 y.o.   MRN: [id number]  Chief Complaint  Patient presents with   Amenorrhea    HPI Patient is in today for amenorrhea. She had unprotected intercourse with her boyfriend who lives out of town about a week ago. She is currently 11 days late. She did take plan B after intercourse. LMP was 08/09/22. She usually has regular menstrual cycles.   Review of Systems  Constitutional:  Negative for chills and fever.  Respiratory:  Negative for cough and shortness of breath.   Cardiovascular:  Negative for chest pain.  Genitourinary:        Amenorrhea  Neurological:  Negative for headaches.        Objective:    BP 102/64   Pulse 86   Ht '5\' 4"'$  (1.626 m)   Wt 166 lb (75.3 kg)   SpO2 98%   BMI 28.49 kg/m    Physical Exam Vitals reviewed.  Constitutional:      Appearance: She is well-developed.  HENT:     Head: Normocephalic and atraumatic.  Eyes:     Conjunctiva/sclera: Conjunctivae normal.  Cardiovascular:     Rate and Rhythm: Normal rate.  Pulmonary:     Effort: Pulmonary effort is normal.  Skin:    General: Skin is dry.     Coloration: Skin is not pale.  Neurological:     Mental Status: She is alert and oriented to person, place, and time.  Psychiatric:        Behavior: Behavior normal.     No results found for any visits on 09/09/22.      Assessment & Plan:   Problem List Items Addressed This Visit       Other   Amenorrhea - Primary    - POC urine Pregnancy test interpreted by myself and was negative  - did get bhcg since it may be too early to tell - I wonder if the plan B is affecting her menstrual cycle and this is why she is late  - obviously a differential for amenorrhea could be pregnancy in this case others could be the use of plan b or stress      Relevant Orders   POCT urine pregnancy   B-HCG Quant    No orders of the defined types were placed  in this encounter.    Owens Loffler, DO

## 2022-09-10 ENCOUNTER — Other Ambulatory Visit: Payer: Self-pay | Admitting: Family Medicine

## 2022-09-10 ENCOUNTER — Encounter: Payer: Self-pay | Admitting: Family Medicine

## 2022-09-10 DIAGNOSIS — N912 Amenorrhea, unspecified: Secondary | ICD-10-CM

## 2022-09-10 LAB — HCG, QUANTITATIVE, PREGNANCY: HCG, Total, QN: <5 m[IU]/mL

## 2022-11-01 LAB — HM PAP SMEAR: HM Pap smear: POSITIVE

## 2023-02-17 ENCOUNTER — Ambulatory Visit: Payer: Managed Care, Other (non HMO) | Admitting: Family Medicine

## 2023-02-28 ENCOUNTER — Encounter: Payer: Self-pay | Admitting: Family Medicine

## 2023-02-28 ENCOUNTER — Ambulatory Visit: Payer: Managed Care, Other (non HMO) | Admitting: Family Medicine

## 2023-02-28 VITALS — BP 109/65 | HR 87 | Temp 98.2°F | Ht 64.0 in | Wt 188.7 lb

## 2023-02-28 DIAGNOSIS — Z7689 Persons encountering health services in other specified circumstances: Secondary | ICD-10-CM

## 2023-02-28 DIAGNOSIS — R5383 Other fatigue: Secondary | ICD-10-CM | POA: Diagnosis not present

## 2023-02-28 DIAGNOSIS — R7309 Other abnormal glucose: Secondary | ICD-10-CM

## 2023-02-28 DIAGNOSIS — R635 Abnormal weight gain: Secondary | ICD-10-CM | POA: Diagnosis not present

## 2023-02-28 LAB — CBC WITH DIFFERENTIAL/PLATELET
Absolute Monocytes: 585 cells/uL (ref 200–950)
Basophils Absolute: 32 cells/uL (ref 0–200)
Basophils Relative: 0.4 %
Eosinophils Absolute: 87 cells/uL (ref 15–500)
Eosinophils Relative: 1.1 %
HCT: 39 % (ref 35.0–45.0)
Hemoglobin: 12.4 g/dL (ref 11.7–15.5)
Lymphs Abs: 2227.8 cells/uL (ref 850–3900)
MCH: 26.2 pg — ABNORMAL LOW (ref 27.0–33.0)
MCHC: 31.8 g/dL — ABNORMAL LOW (ref 32.0–36.0)
MCV: 82.3 fL (ref 80.0–100.0)
MPV: 9.7 fL (ref 7.5–12.5)
Monocytes Relative: 7.4 %
Neutro Abs: 4969 cells/uL (ref 1500–7800)
Neutrophils Relative %: 62.9 %
Platelets: 334 10*3/uL (ref 140–400)
RBC: 4.74 10*6/uL (ref 3.80–5.10)
RDW: 13.2 % (ref 11.0–15.0)
Total Lymphocyte: 28.2 %
WBC: 7.9 10*3/uL (ref 3.8–10.8)

## 2023-02-28 LAB — POCT GLYCOSYLATED HEMOGLOBIN (HGB A1C): Hemoglobin A1C: 3.2 % — AB (ref 4.0–5.6)

## 2023-02-28 LAB — TSH: TSH: 2.34 mIU/L

## 2023-02-28 MED ORDER — PHENTERMINE HCL 15 MG PO CAPS
15.0000 mg | ORAL_CAPSULE | ORAL | 1 refills | Status: DC
Start: 2023-02-28 — End: 2023-04-19

## 2023-02-28 NOTE — Patient Instructions (Signed)
He can check with the insurance and see if they would cover Zepp bound, Wegovy, Saxenda.  Other oral options are Qsymia, and Contrave.

## 2023-02-28 NOTE — Assessment & Plan Note (Addendum)
Visit #:1 Starting Weight: 188 lbs   Current weight: 188 lbs  Previous weight: Change in weight: Goal weight: 160 lbs, and ideally 145 lbs  Dietary goals: continue to work on healthy food choices.  Exercise goals: contnue to work on regular exercise Medication: start phentermine 15mg  daily  Follow-up and referrals: 6 weeks

## 2023-02-28 NOTE — Progress Notes (Signed)
Established Patient Office Visit  Subjective   Patient ID: Sierra Mccullough, female    DOB: [birth year]  Age: 32 y.o. MRN: [id number]  Chief Complaint  Patient presents with   Medication Management    Patient is here to discuss starting Ozempic    HPI She would liek to discuss weight management.  She is particularly interested in [place].  She has taken phentermine in the past for 1 month at a time.  She went to a local weight loss clinic where they prescribed for a month but then could not afford to go back.  But she did get good results and did not have any side effects or problems with it.  She says in the past year she is really tried to exercise more she is trying to improve her diet she says she can do really well for couple days and then struggles a little bit with portion control.  She just cannot seem to really gain any traction and get her weight down.  She says she would really like to get down to 160 pounds to start, but her ideal goal would be in the mid 140s.  Is also felt more fatigued for months.    ROS    Objective:     BP 109/65   Pulse 87   Temp 98.2 F (36.8 C) (Oral)   Ht 5\' 4"  (1.626 m)   Wt 188 lb 11.2 oz (85.6 kg)   BMI 32.39 kg/m    Physical Exam Vitals and nursing note reviewed.  Constitutional:      Appearance: She is well-developed.  HENT:     Head: Normocephalic and atraumatic.  Cardiovascular:     Rate and Rhythm: Normal rate and regular rhythm.     Heart sounds: Normal heart sounds.  Pulmonary:     Effort: Pulmonary effort is normal.     Breath sounds: Normal breath sounds.  Skin:    General: Skin is warm and dry.  Neurological:     Mental Status: She is alert and oriented to person, place, and time.  Psychiatric:        Behavior: Behavior normal.      Results for orders placed or performed in visit on 02/28/23  POCT HgB A1C  Result Value Ref Range   Hemoglobin A1C 3.2 (A) 4.0 - 5.6 %   HbA1c POC (<> result, manual entry)      HbA1c, POC (prediabetic range)     HbA1c, POC (controlled diabetic range)        The ASCVD Risk score (Arnett DK, et al., 2019) failed to calculate for the following reasons:   The 2019 ASCVD risk score is only valid for ages 25 to 31    Assessment & Plan:   Problem List Items Addressed This Visit       Other   Encounter for weight management    Visit #:1 Starting Weight: 188 lbs   Current weight: 188 lbs  Previous weight: Change in weight: Goal weight: 160 lbs, and ideally 145 lbs  Dietary goals: continue to work on healthy food choices.  Exercise goals: contnue to work on regular exercise Medication: start phentermine 15mg  daily  Follow-up and referrals: 6 weeks       Relevant Medications   phentermine 15 MG capsule   Other Relevant Orders   TSH   CBC with Differential/Platelet   Other Visit Diagnoses     Abnormal glucose    -  Primary  Relevant Orders   POCT HgB A1C (Completed)   TSH   CBC with Differential/Platelet   Other fatigue       Relevant Orders   TSH   CBC with Differential/Platelet   Abnormal weight gain       Relevant Orders   TSH   CBC with Differential/Platelet      Fatigue-we did do an A1c today to make sure with her medications and difficulty with weight loss that she did not have prediabetes or diabetes.  A1c was actually low at 3.2 which makes me concerned that she could actually be anemic.  So we will check a CBC with differential.  Will also check a TSH to rule out other potential causes for weight gain.  Return in about 6 weeks (around 04/11/2023) for weight managment .    Beatrice Lecher, MD

## 2023-03-01 NOTE — Progress Notes (Signed)
Your lab work is within acceptable range and there are no concerning findings.   ?

## 2023-04-19 ENCOUNTER — Encounter: Payer: Self-pay | Admitting: Family Medicine

## 2023-04-19 ENCOUNTER — Ambulatory Visit: Payer: Managed Care, Other (non HMO) | Admitting: Family Medicine

## 2023-04-19 VITALS — BP 108/52 | HR 81 | Ht 64.0 in | Wt 186.0 lb

## 2023-04-19 DIAGNOSIS — Z713 Dietary counseling and surveillance: Secondary | ICD-10-CM

## 2023-04-19 DIAGNOSIS — Z7689 Persons encountering health services in other specified circumstances: Secondary | ICD-10-CM

## 2023-04-19 MED ORDER — PHENTERMINE HCL 15 MG PO CAPS: 15.0000 mg | ORAL_CAPSULE | ORAL | 1 refills | Status: DC

## 2023-04-19 NOTE — Assessment & Plan Note (Addendum)
Visit #:2 Starting Weight: 188 lbs    Current weight: 188 lbs  Previous weight: 186 lbs  Change in weight: down 2 lb, off meds x 2 weeks.   Goal weight: 160 lbs, and ideally 145 lbs  Dietary goals: continue to work on [employer].  Exercise goals: Encouraged her to get moving doing some walking stretching bands.  She just has not felt very motivated to exercise. Medication: Restart phentermine 15mg  daily  Follow-up and referrals: 6 weeks

## 2023-04-19 NOTE — Progress Notes (Signed)
   Established Patient Office Visit  Subjective   Patient ID: Sierra Mccullough, female    DOB: 02/08/1991  Age: 32 y.o. MRN: [id number]  Chief Complaint  Patient presents with   Weight Check    She has been out of her medication x 2 weeks. She was on vacation and just got back    HPI   Weight management-she took the first month of phentermine 15 mg and did well she denies any chest pain palpitation shortness of breath or insomnia on the medication she felt like it was actually very helpful.  On her home scale she actually lost about 9 pounds.  She was due for refill about 2 weeks ago right before she and her boyfriend went on vacation.  She was unable to get it refilled before she left.    Her boyfriend proposed in [place]!!!!  She also recently started weight watchers.    ROS    Objective:     BP (!) 108/52   Pulse 81   Ht 5\' 4"  (1.626 m)   Wt 186 lb (84.4 kg)   SpO2 98%   BMI 31.93 kg/m    Physical Exam Vitals and nursing note reviewed.  Constitutional:      Appearance: She is well-developed.  HENT:     Head: Normocephalic and atraumatic.  Cardiovascular:     Rate and Rhythm: Normal rate and regular rhythm.     Heart sounds: Normal heart sounds.  Pulmonary:     Effort: Pulmonary effort is normal.     Breath sounds: Normal breath sounds.  Skin:    General: Skin is warm and dry.  Neurological:     Mental Status: She is alert and oriented to person, place, and time.  Psychiatric:        Behavior: Behavior normal.      Results for orders placed or performed in visit on 04/19/23  HM PAP SMEAR  Result Value Ref Range   HM Pap smear HPV positive       The ASCVD Risk score (Arnett DK, et al., 2019) failed to calculate for the following reasons:   The 2019 ASCVD risk score is only valid for ages 62 to 4    Assessment & Plan:   Problem List Items Addressed This Visit       Other   Encounter for weight management - Primary    Visit #:2 Starting  Weight: 188 lbs    Current weight: 188 lbs  Previous weight: 186 lbs  Change in weight: down 2 lb, off meds x 2 weeks.   Goal weight: 160 lbs, and ideally 145 lbs  Dietary goals: continue to work on [employer].  Exercise goals: Encouraged her to get moving doing some walking stretching bands.  She just has not felt very motivated to exercise. Medication: Restart phentermine 15mg  daily  Follow-up and referrals: 6 weeks      Relevant Medications   phentermine 15 MG capsule    Return in about 7 weeks (around 06/07/2023) for Med check on Phentermine.    Nani Gasser, MD

## 2023-06-07 ENCOUNTER — Ambulatory Visit: Payer: Managed Care, Other (non HMO) | Admitting: Family Medicine

## 2023-06-07 ENCOUNTER — Encounter: Payer: Self-pay | Admitting: Family Medicine

## 2023-06-07 DIAGNOSIS — Z713 Dietary counseling and surveillance: Secondary | ICD-10-CM | POA: Diagnosis not present

## 2023-06-07 DIAGNOSIS — Z7689 Persons encountering health services in other specified circumstances: Secondary | ICD-10-CM

## 2023-06-07 MED ORDER — PHENTERMINE HCL 15 MG PO CAPS: 15.0000 mg | ORAL_CAPSULE | ORAL | 0 refills | Status: DC

## 2023-06-07 NOTE — Assessment & Plan Note (Signed)
Visit #:3 Starting Weight: 188 lbs    Current weight: 178 lbs  Previous weight: 188 lbs  Change in weight: down 10 lbs    Goal weight: 160 lbs, and ideally 145 lbs  Dietary goals: Working on increasing vegetable intake and snacking less. Exercise goals: Encouraged her to get moving doing some walking ,stretching bands.  Keep up the swimming as well but try to definitely incorporate and resistance training 2 to 3 days/week. Medication: Cont phentermine 15mg  daily  Follow-up and referrals: 6 weeks

## 2023-06-07 NOTE — Progress Notes (Signed)
Patient doing well on appetite suppressant. Denies problems with insomnia, heart palpitations or tremors, working on healthy diet and regular exercise.   She has lost 8lbs since her last OV

## 2023-06-07 NOTE — Progress Notes (Signed)
   Established Patient Office Visit  Subjective   Patient ID: Sierra Mccullough, female    DOB: August 04, 1991  Age: 32 y.o. MRN: [id number]  Chief Complaint  Patient presents with   Weight Check    HPI   Patient doing well on appetite suppressant. Denies problems with insomnia, heart palpitations or tremors, working on healthy diet and regular exercise.    She has lost 8lbs since her last OV. She is snacking less and feels she is getting more energy.       ROS    Objective:     BP 116/71   Pulse 93   Ht 5\' 4"  (1.626 m)   Wt 178 lb (80.7 kg)   LMP 05/24/2023 (Approximate)   SpO2 100%   BMI 30.55 kg/m    Physical Exam Vitals and nursing note reviewed.  Constitutional:      Appearance: She is well-developed.  HENT:     Head: Normocephalic and atraumatic.  Cardiovascular:     Rate and Rhythm: Normal rate and regular rhythm.     Heart sounds: Normal heart sounds.  Pulmonary:     Effort: Pulmonary effort is normal.     Breath sounds: Normal breath sounds.  Skin:    General: Skin is warm and dry.  Neurological:     Mental Status: She is alert and oriented to person, place, and time.  Psychiatric:        Behavior: Behavior normal.      No results found for any visits on 06/07/23.    The ASCVD Risk score (Arnett DK, et al., 2019) failed to calculate for the following reasons:   The 2019 ASCVD risk score is only valid for ages 72 to 94    Assessment & Plan:   Problem List Items Addressed This Visit       Other   Encounter for weight management    Visit #:3 Starting Weight: 188 lbs    Current weight: 178 lbs  Previous weight: 188 lbs  Change in weight: down 10 lbs    Goal weight: 160 lbs, and ideally 145 lbs  Dietary goals: Working on increasing vegetable intake and snacking less. Exercise goals: Encouraged her to get moving doing some walking ,stretching bands.  Keep up the swimming as well but try to definitely incorporate and resistance training 2 to  3 days/week. Medication: Cont phentermine 15mg  daily  Follow-up and referrals: 6 weeks      Relevant Medications   phentermine 15 MG capsule    No follow-ups on file.   I spent 18 minutes on the day of the encounter to include pre-visit record review, face-to-face time with the patient and post visit ordering of test.   Nani Gasser, MD

## 2023-07-19 ENCOUNTER — Ambulatory Visit: Payer: Managed Care, Other (non HMO) | Admitting: Family Medicine

## 2023-07-19 LAB — LAB REPORT - SCANNED
A1c: 5.8
EGFR: 116

## 2023-08-02 ENCOUNTER — Telehealth: Payer: Self-pay | Admitting: General Practice

## 2023-08-02 NOTE — Transitions of Care (Post Inpatient/ED Visit) (Signed)
   08/02/2023  Name: Sierra Mccullough MRN: [id number] DOB: 08/04/1991  Today's TOC FU Call Status: Today's TOC FU Call Status:: Successful TOC FU Call Completed TOC FU Call Complete Date: 08/02/23 Patient's Name and Date of Birth confirmed.  Transition Care Management Follow-up Telephone Call Date of Discharge: 07/29/23 Discharge Facility: Other ([facility]) Name of Other ([facility]) Discharge Facility: [facility] Type of Discharge: Emergency Department Reason for ED Visit: Other: (foot pain) How have you been since you were released from the hospital?: Same Any questions or concerns?: No  Items Reviewed: Did you receive and understand the discharge instructions provided?: Yes Medications obtained,verified, and reconciled?: Yes (Medications Reviewed) Any new allergies since your discharge?: No Dietary orders reviewed?: NA Do you have support at home?: Yes  Medications Reviewed Today: Medications Reviewed Today     Reviewed by Modesto Charon, RN (Registered Nurse) on 08/02/23 at 1444  Med List Status: <None>   Medication Order Taking? Sig Documenting Provider Last Dose Status Informant  clonazePAM (KLONOPIN) 0.5 MG tablet [id number]  Take 0.5 mg by mouth daily as needed for anxiety. [provider]  Active   lamoTRIgine (LAMICTAL) 25 MG tablet [id number]  Take 50 mg by mouth daily. [provider]  Active   phentermine 15 MG capsule [id number]  Take 1 capsule (15 mg total) by mouth every morning. Agapito Games, MD  Active   QUEtiapine (SEROQUEL) 25 MG tablet [id number]  Take 25 mg by mouth at bedtime. [provider]  Active   traZODone (DESYREL) 50 MG tablet [id number] No Take 50-100 mg by mouth at bedtime as needed. [provider] Taking Active             Home Care and Equipment/Supplies: Were Home Health Services Ordered?: NA Any new equipment or medical supplies ordered?: NA  Functional Questionnaire: Do you need assistance with  bathing/showering or dressing?: No Do you need assistance with meal preparation?: No Do you need assistance with eating?: No Do you have difficulty maintaining continence: No Do you need assistance with getting out of bed/getting out of a chair/moving?: No Do you have difficulty managing or taking your medications?: No  Follow up appointments reviewed: PCP Follow-up appointment confirmed?: NA Specialist Hospital Follow-up appointment confirmed?: Yes Date of Specialist follow-up appointment?: 08/02/23 Follow-Up Specialty Provider:: [facility] Do you need transportation to your follow-up appointment?: No Do you understand care options if your condition(s) worsen?: Yes-patient verbalized understanding  SDOH Interventions Today    Flowsheet Row Most Recent Value  SDOH Interventions   Transportation Interventions Intervention Not Indicated       SIGNATURE Modesto Charon, RN BSN Nurse Health Advisor

## 2023-08-05 ENCOUNTER — Telehealth: Payer: Self-pay | Admitting: Family Medicine

## 2023-08-05 NOTE — Telephone Encounter (Signed)
Attempted call to patient. Left voice mail message requesting a return call.  

## 2023-08-05 NOTE — Telephone Encounter (Signed)
Please call patient and let her know that I did receive a copy of her blood work.  Her cholesterol was quite high I would highly encourage her to work on healthy diet and exercise to bring that down.  Kidney function looked good but her liver function was actually elevated.  I would highly recommend that she have those repeated in a month were happy to do it here or if someone else is checking it then it is fine make sure avoiding any excess Tylenol or alcohol products to reduce inflammation in the liver.  Her thyroid look great.  Hemoglobin A1c was actually mildly elevated in the prediabetes range so would recommend that we also check that and again in about 3 months to see if it is trending back down.

## 2023-08-05 NOTE — Telephone Encounter (Signed)
Patient informed. She states she has an appt coming up with Dr. Linford Arnold on 08/24/2023 to discuss eligibility for GLP-1 medication.

## 2023-08-24 ENCOUNTER — Ambulatory Visit: Payer: Managed Care, Other (non HMO) | Admitting: Family Medicine

## 2023-08-24 ENCOUNTER — Encounter: Payer: Self-pay | Admitting: Family Medicine

## 2023-08-24 VITALS — BP 109/56 | HR 97 | Ht 64.0 in | Wt 169.0 lb

## 2023-08-24 DIAGNOSIS — E785 Hyperlipidemia, unspecified: Secondary | ICD-10-CM | POA: Diagnosis not present

## 2023-08-24 DIAGNOSIS — R7301 Impaired fasting glucose: Secondary | ICD-10-CM | POA: Insufficient documentation

## 2023-08-24 DIAGNOSIS — R748 Abnormal levels of other serum enzymes: Secondary | ICD-10-CM | POA: Insufficient documentation

## 2023-08-24 DIAGNOSIS — R7309 Other abnormal glucose: Secondary | ICD-10-CM

## 2023-08-24 DIAGNOSIS — J301 Allergic rhinitis due to pollen: Secondary | ICD-10-CM | POA: Diagnosis not present

## 2023-08-24 DIAGNOSIS — Z7689 Persons encountering health services in other specified circumstances: Secondary | ICD-10-CM

## 2023-08-24 DIAGNOSIS — E1169 Type 2 diabetes mellitus with other specified complication: Secondary | ICD-10-CM | POA: Insufficient documentation

## 2023-08-24 DIAGNOSIS — Z6830 Body mass index (BMI) 30.0-30.9, adult: Secondary | ICD-10-CM

## 2023-08-24 MED ORDER — TIRZEPATIDE-WEIGHT MANAGEMENT 2.5 MG/0.5ML ~~LOC~~ SOLN
2.5000 mg | SUBCUTANEOUS | 0 refills | Status: DC
Start: 2023-08-24 — End: 2024-09-04

## 2023-08-24 NOTE — Addendum Note (Signed)
Addended by: Nani Gasser D on: 08/24/2023 12:49 PM   Modules accepted: Orders

## 2023-08-24 NOTE — Progress Notes (Addendum)
Established Patient Office Visit  Subjective   Patient ID: Sierra Mccullough, female    DOB: 1991-08-09  Age: 32 y.o. MRN: [id number]  Chief Complaint  Patient presents with   Results   Weight Check    HPI She would like to go over recent labs that showed elevated liver enzymes, abnormal A1C and high cholesterol levels.   She has not used alcohol in about 7 months  She has been on mood stabilizers.   Having chronic nasal congestion with drainage x 1 months.  No fever or chills.  Has had bad allergies this year. Just started claritin and nasal spray about 4 days ago.    Has also been dealing with foot pain with her Podiatrist. Just got out of boot but has made it really hard to exercise and she really wants to get back to being active.     ROS    Objective:     BP (!) 109/56   Pulse 97   Ht 5\' 4"  (1.626 m)   Wt 169 lb (76.7 kg)   SpO2 98%   BMI 29.01 kg/m    Physical Exam Vitals and nursing note reviewed.  Constitutional:      Appearance: Normal appearance.  HENT:     Head: Normocephalic and atraumatic.  Eyes:     Conjunctiva/sclera: Conjunctivae normal.  Cardiovascular:     Rate and Rhythm: Normal rate and regular rhythm.  Pulmonary:     Effort: Pulmonary effort is normal.     Breath sounds: Normal breath sounds.  Skin:    General: Skin is warm and dry.  Neurological:     Mental Status: She is alert.  Psychiatric:        Mood and Affect: Mood normal.      No results found for any visits on 08/24/23.    The ASCVD Risk score (Arnett DK, et al., 2019) failed to calculate for the following reasons:   The 2019 ASCVD risk score is only valid for ages 62 to 49    Assessment & Plan:   Problem List Items Addressed This Visit       Respiratory   Allergic rhinitis due to pollen    Use 2 sprays of flonase in both nostrils after saline irrigation. Has started oral clariting as well. Has already been on steroids twice recently so will hold off.         Relevant Orders   CMP14+EGFR   Direct LDL     Endocrine   IFG (impaired fasting glucose)   Relevant Medications   tirzepatide (ZEPBOUND) 2.5 MG/0.5ML injection vial   Hyperlipidemia associated with type 2 diabetes mellitus (HCC)   Relevant Medications   tirzepatide (ZEPBOUND) 2.5 MG/0.5ML injection vial   Other Relevant Orders   CMP14+EGFR   Direct LDL   Acute Viral Hepatitis (HAV, HBV, HCV)     Other   Encounter for weight management - Primary    Visit #:4 Starting Weight: 188 lbs    Current weight: 169 lbs  Previous weight: 178 lbs  Change in weight: down 9 lbs    Goal weight: 160 lbs, and ideally 145 lbs  Dietary goals: Working on increasing vegetable intake and snacking less. Exercise goals: Encouraged her to get moving doing some walking ,stretching bands.  Keep up the swimming as well but try to definitely incorporate and resistance training 2 to 3 days/week. Medication: none. Can see if insurance will cover GLP1 for prediabetes.  Follow-up and referrals: 8  weeks      Elevated liver enzymes    Plant to recheck today. If still elevated will plan for [place].        Relevant Medications   tirzepatide (ZEPBOUND) 2.5 MG/0.5ML injection vial   Other Relevant Orders   CMP14+EGFR   Direct LDL   Acute Viral Hepatitis (HAV, HBV, HCV)   Other Visit Diagnoses     Abnormal glucose       Relevant Medications   tirzepatide (ZEPBOUND) 2.5 MG/0.5ML injection vial   Other Relevant Orders   Acute Viral Hepatitis (HAV, HBV, HCV)   BMI 30.0-30.9,adult       Relevant Medications   tirzepatide (ZEPBOUND) 2.5 MG/0.5ML injection vial       Return in about 6 weeks (around 10/05/2023) for New start medication.    Nani Gasser, MD

## 2023-08-24 NOTE — Assessment & Plan Note (Signed)
Plant to recheck today. If still elevated will plan for [place].

## 2023-08-24 NOTE — Assessment & Plan Note (Signed)
Visit #:4 Starting Weight: 188 lbs    Current weight: 169 lbs  Previous weight: 178 lbs  Change in weight: down 9 lbs    Goal weight: 160 lbs, and ideally 145 lbs  Dietary goals: Working on increasing vegetable intake and snacking less. Exercise goals: Encouraged her to get moving doing some walking ,stretching bands.  Keep up the swimming as well but try to definitely incorporate and resistance training 2 to 3 days/week. Medication: none. Can see if insurance will cover GLP1 for prediabetes.  Follow-up and referrals: 8 weeks

## 2023-08-24 NOTE — Assessment & Plan Note (Signed)
Use 2 sprays of flonase in both nostrils after saline irrigation. Has started oral clariting as well. Has already been on steroids twice recently so will hold off.

## 2023-08-25 LAB — CMP14+EGFR
ALT: 27 IU/L (ref 0–32)
AST: 21 IU/L (ref 0–40)
Albumin: 4.4 g/dL (ref 4.0–5.0)
Alkaline Phosphatase: 103 IU/L (ref 44–121)
BUN/Creatinine Ratio: 14 (ref 9–23)
BUN: 9 mg/dL (ref 6–20)
Bilirubin Total: 0.3 mg/dL (ref 0.0–1.2)
CO2: 22 mmol/L (ref 20–29)
Calcium: 9.2 mg/dL (ref 8.7–10.2)
Chloride: 104 mmol/L (ref 96–106)
Creatinine, Ser: 0.66 mg/dL (ref 0.57–1.00)
Globulin, Total: 2.8 g/dL (ref 1.5–4.5)
Glucose: 85 mg/dL (ref 70–99)
Potassium: 4.1 mmol/L (ref 3.5–5.2)
Sodium: 142 mmol/L (ref 134–144)
Total Protein: 7.2 g/dL (ref 6.0–8.5)
eGFR: 122 mL/min/{1.73_m2} (ref >59)

## 2023-08-25 LAB — ACUTE VIRAL HEPATITIS (HAV, HBV, HCV)
HCV Ab: NONREACTIVE
Hep A IgM: NEGATIVE
Hep B C IgM: NEGATIVE
Hepatitis B Surface Ag: NEGATIVE

## 2023-08-25 LAB — LDL CHOLESTEROL, DIRECT: LDL Direct: 158 mg/dL — ABNORMAL HIGH (ref 0–99)

## 2023-08-25 LAB — HCV INTERPRETATION

## 2023-08-25 NOTE — Progress Notes (Signed)
Hi Sierra Mccullough, cholesterol looks better.  Your LDL was 178 now it is 158 that is down 20 points.  Our goal is to get it under 100 so just keep working at [employer] and regular exercise.  You are negative for acute hepatitis A B or C.  Great news!  Liver enzymes are back to normal and kidney function looks great so I feel really reassured.

## 2023-10-06 ENCOUNTER — Ambulatory Visit: Payer: Managed Care, Other (non HMO) | Admitting: Family Medicine

## 2023-10-10 ENCOUNTER — Ambulatory Visit: Payer: Managed Care, Other (non HMO)

## 2023-10-11 ENCOUNTER — Ambulatory Visit (INDEPENDENT_AMBULATORY_CARE_PROVIDER_SITE_OTHER): Payer: Managed Care, Other (non HMO)

## 2023-10-11 VITALS — BP 135/85 | HR 104 | Ht 64.0 in | Wt 167.0 lb

## 2023-10-11 DIAGNOSIS — Z111 Encounter for screening for respiratory tuberculosis: Secondary | ICD-10-CM | POA: Diagnosis not present

## 2023-10-11 NOTE — Progress Notes (Unsigned)
(  [facility] AMB POC)  HPI  Pt here for PPD placement              Assessment and Plan   Pt tolerated injection well without complications. (Pt to return between thurs. 10/13/23-11/15 to have read)

## 2023-10-12 ENCOUNTER — Ambulatory Visit: Payer: Managed Care, Other (non HMO)

## 2023-10-13 ENCOUNTER — Ambulatory Visit (INDEPENDENT_AMBULATORY_CARE_PROVIDER_SITE_OTHER): Payer: Managed Care, Other (non HMO) | Admitting: Family Medicine

## 2023-10-13 DIAGNOSIS — Z111 Encounter for screening for respiratory tuberculosis: Secondary | ICD-10-CM | POA: Diagnosis not present

## 2023-10-13 LAB — TB SKIN TEST
Induration: 0 mm
TB Skin Test: NEGATIVE

## 2023-10-13 NOTE — Progress Notes (Signed)
Agree with documentation as above.   Teonna Coonan, MD  

## 2023-10-13 NOTE — Progress Notes (Signed)
   Subjective:    Patient ID: Sierra Mccullough, female    DOB: 1991/03/27, 32 y.o.   MRN: [id number]  HPI Patient here for PPd Read.   Review of Systems     Objective:   Physical Exam        Assessment & Plan:  Results were Negative

## 2023-10-14 ENCOUNTER — Ambulatory Visit: Payer: Managed Care, Other (non HMO)

## 2023-11-02 LAB — HM PAP SMEAR

## 2024-02-07 ENCOUNTER — Telehealth: Payer: Self-pay | Admitting: Family Medicine

## 2024-02-07 NOTE — Telephone Encounter (Unsigned)
 Copied from CRM [id number]. Topic: Clinical - Medication Refill >> Feb 07, 2024 10:52 AM Higinio Roger wrote: Department: [facility]  Visit Type: NURSE VISIT  Date: 10/13/2023  Medication: Phentermine  Has the patient contacted their pharmacy? No (Agent: If no, request that the patient contact the pharmacy for the refill. If patient does not wish to contact the pharmacy document the reason why and proceed with request.)  Patient is requesting to have this medication sent to pharmacy due to being on it in the past.  (Agent: If yes, when and what did the pharmacy advise?)  Is this the correct pharmacy for this prescription? Yes If no, delete pharmacy and type the correct one.  This is the patient's preferred pharmacy:   [phone] [place], [place] - [address] [place] [address] Phone: [phone] Fax: [phone]   Has the prescription been filled recently? No  Is the patient out of the medication? Yes  Has the patient been seen for an appointment in the last year OR does the patient have an upcoming appointment? Yes  Can we respond through MyChart? Yes  Agent: Please be advised that Rx refills may take up to 3 business days. We ask that you follow-up with your pharmacy.

## 2024-02-07 NOTE — Telephone Encounter (Signed)
 Last Fill: 06/07/23-not on current med list, past med  Last OV: 08/24/23 Next OV: None Scheduled  Routing to provider for review/authorization.

## 2024-02-07 NOTE — Telephone Encounter (Signed)
 Copied from CRM [id number]. Topic: Clinical - Medication Refill >> Feb 07, 2024 10:49 AM Higinio Roger wrote: Most Recent Primary Care Visit:  Provider: Nani Gasser D  Department: [facility]  Visit Type: NURSE VISIT  Date: 10/13/2023  Medication: Phentermine  Has the patient contacted their pharmacy? No (Agent: If no, request that the patient contact the pharmacy for the refill. If patient does not wish to contact the pharmacy document the reason why and proceed with request.)  Patient is requesting to have this medication sent to pharmacy due to being on it in the past.  (Agent: If yes, when and what did the pharmacy advise?)  Is this the correct pharmacy for this prescription? Yes If no, delete pharmacy and type the correct one.  This is the patient's preferred pharmacy:   [phone] [place], [place] - [address] [place] [address] Phone: [phone] Fax: [phone]   Has the prescription been filled recently? No  Is the patient out of the medication? Yes  Has the patient been seen for an appointment in the last year OR does the patient have an upcoming appointment? Yes  Can we respond through MyChart? Yes  Agent: Please be advised that Rx refills may take up to 3 business days. We ask that you follow-up with your pharmacy.

## 2024-09-04 ENCOUNTER — Telehealth: Payer: Self-pay | Admitting: Family Medicine

## 2024-09-04 ENCOUNTER — Ambulatory Visit: Admitting: Family Medicine

## 2024-09-04 ENCOUNTER — Encounter: Payer: Self-pay | Admitting: Family Medicine

## 2024-09-04 VITALS — BP 125/87 | HR 89 | Ht 64.0 in | Wt 165.6 lb

## 2024-09-04 DIAGNOSIS — E663 Overweight: Secondary | ICD-10-CM

## 2024-09-04 DIAGNOSIS — E282 Polycystic ovarian syndrome: Secondary | ICD-10-CM

## 2024-09-04 DIAGNOSIS — K760 Fatty (change of) liver, not elsewhere classified: Secondary | ICD-10-CM | POA: Insufficient documentation

## 2024-09-04 DIAGNOSIS — Z23 Encounter for immunization: Secondary | ICD-10-CM

## 2024-09-04 DIAGNOSIS — R1012 Left upper quadrant pain: Secondary | ICD-10-CM

## 2024-09-04 DIAGNOSIS — E785 Hyperlipidemia, unspecified: Secondary | ICD-10-CM | POA: Insufficient documentation

## 2024-09-04 DIAGNOSIS — R1010 Upper abdominal pain, unspecified: Secondary | ICD-10-CM

## 2024-09-04 DIAGNOSIS — R748 Abnormal levels of other serum enzymes: Secondary | ICD-10-CM | POA: Diagnosis not present

## 2024-09-04 NOTE — Assessment & Plan Note (Signed)
 Hypercholesterolemia Cholesterol levels previously high. She is working on dietary improvements. - Review cholesterol levels.

## 2024-09-04 NOTE — Assessment & Plan Note (Signed)
 See note under fatty liver

## 2024-09-04 NOTE — Telephone Encounter (Signed)
 Please check to see if the Zepbound  had a PA come across or not.  I believe she works for [employer].  She has hyperlipidemia and fatty liver.

## 2024-09-04 NOTE — Progress Notes (Signed)
 Established Patient Office Visit  Subjective  Patient ID: Sierra Mccullough, female    DOB: 08/10/1991  Age: 33 y.o. MRN: [id number]  Chief Complaint  Patient presents with   Hospitalization Follow-up    HPI   Discussed the use of AI scribe software for clinical note transcription with the patient, who gave verbal consent to proceed.  History of Present Illness Sierra Mccullough is a 33 year old female who presents with epigastric pain and elevated liver enzymes.  Epigastric pain - Burning pain across the upper abdomen, specifically under the ribs - Onset after consuming foods typically avoided - Initial episode severe enough that she could not get off the floor - Pain now primarily on the left upper side of abdomen, described as uncomfortable but less severe than initial episode - GI cocktail in the emergency room provided more relief than pain medications - No current use of Tums or other medications for symptoms, as pain has not been as severe since the initial episode  Elevated hepatic enzymes - Significantly elevated liver enzymes noted - No alcohol consumption for almost two years - Avoids Tylenol  unless necessary for menstrual pain - Iron levels were okay.  Negative mitochondrial antibodies.  Normal smooth muscle antibody levels.  Negative ANA. - NASH FibroSure plus results indicate steatosis score of 0.86 with Nash score of 0.56 which is consistent with moderate levels. - GGT 359, ALT 174, AST 42, total cholesterol of 221 and triglycerides of 234.  Abdominal bloating - Bloating occurs even after consuming small, healthy meals - Bloating is a new symptom; previously able to eat without issue  Dietary modifications and supplement use - Recently diagnosed with PCOS and attempting to follow a PCOS-friendly diet - Occasional use of a super greens supplement in smoothies once or twice a week - Reduced caffeine intake to one cup of coffee before work shift - Avoids energy  drinks, acidic foods, and artificial sweeteners     ROS    Objective:     BP 125/87   Pulse 89   Ht 5' 4 (1.626 m)   Wt 165 lb 9.6 oz (75.1 kg)   SpO2 100%   BMI 28.43 kg/m    Physical Exam Vitals reviewed.  Constitutional:      Appearance: Normal appearance.  HENT:     Head: Normocephalic.  Pulmonary:     Effort: Pulmonary effort is normal.  Neurological:     Mental Status: She is alert and oriented to person, place, and time.  Psychiatric:        Mood and Affect: Mood normal.        Behavior: Behavior normal.      Results for orders placed or performed in visit on 09/04/24  HM PAP SMEAR  Result Value Ref Range   HM Pap smear ASCUS       The ASCVD Risk score (Arnett DK, et al., 2019) failed to calculate for the following reasons:   The 2019 ASCVD risk score is only valid for ages 30 to 43    Assessment & Plan:   Problem List Items Addressed This Visit       Digestive   Fatty liver   Elevated liver enzymes Liver enzymes significantly elevated, higher than typical for fatty liver disease. No alcohol consumption for almost two years. No over-the-counter supplements or medications known to affect liver function, except for occasional Tylenol . Supergreens supplement mentioned to GI doctor. Fatty liver considered but unlikely due to dietary improvements and  enzyme levels exceeding typical fatty liver range. - Repeat liver enzyme tests after endoscopy. - will see if can see get GLP-1 covered with insurnace.    - if not then consider alternatives.         Endocrine   PCOS (polycystic ovarian syndrome)   Polycystic ovary syndrome (PCOS) Recently diagnosed with PCOS. Attempting to follow a PCOS-friendly diet and reduce artificial sweeteners. No current medication for PCOS due to lack of insurance approval.        Other   Hyperlipidemia   Hypercholesterolemia Cholesterol levels previously high. She is working on dietary improvements. - Review  cholesterol levels.      Elevated liver enzymes   See note under fatty liver      Other Visit Diagnoses       Encounter for immunization    -  Primary   Relevant Orders   Flu vaccine trivalent PF, 6mos and older(Flulaval,Afluria,Fluarix,Fluzone) (Completed)     LUQ pain       Relevant Orders   CMP14+EGFR   Food Allergy Profile     Upper abdominal pain         Overweight           Assessment and Plan Assessment & Plan Epigastric pain now more LUQ pain  Intermittent burning pain in the epigastric region, primarily on the left side, with episodes of severe discomfort. Pain improved with GI cocktail. No signs of pancreatitis as lipase levels are normal. Consideration of food sensitivities as a potential cause of symptoms. - Perform endoscopy on October 20th. - Consider testing for H. pylori during endoscopy. - Consider testing for food sensitivities if endoscopy is unrevealing.  Overweight She is working on weight loss through dietary changes and exercise. No current medication due to insurance issues. - Plan to recheck lipid levels maybe in 3 to 4 months to see if improving.  General Health Maintenance She is making lifestyle modifications, including reducing caffeine and artificial sweeteners, and increasing exercise.    Return in about 3 months (around 12/05/2024) for recheck cholesterol .    Sierra Byars, MD

## 2024-09-04 NOTE — Assessment & Plan Note (Signed)
 Polycystic ovary syndrome (PCOS) Recently diagnosed with PCOS. Attempting to follow a PCOS-friendly diet and reduce artificial sweeteners. No current medication for PCOS due to lack of insurance approval.

## 2024-09-04 NOTE — Assessment & Plan Note (Addendum)
 Elevated liver enzymes Liver enzymes significantly elevated, higher than typical for fatty liver disease. No alcohol consumption for almost two years. No over-the-counter supplements or medications known to affect liver function, except for occasional Tylenol . Supergreens supplement mentioned to GI doctor. Fatty liver considered but unlikely due to dietary improvements and enzyme levels exceeding typical fatty liver range. - Repeat liver enzyme tests after endoscopy. - will see if can see get GLP-1 covered with insurnace.    - if not then consider alternatives.

## 2024-09-05 ENCOUNTER — Other Ambulatory Visit (HOSPITAL_COMMUNITY): Payer: Self-pay

## 2024-09-05 NOTE — Telephone Encounter (Signed)
PA request has been Submitted.

## 2024-09-07 ENCOUNTER — Other Ambulatory Visit (HOSPITAL_COMMUNITY): Payer: Self-pay

## 2024-09-07 NOTE — Telephone Encounter (Signed)
 Pharmacy Patient Advocate Encounter  Received notification from [facility] that Prior Authorization for ZEPBOUND  has been APPROVED from 09/06/24 to 03/28/25   PA #/Case ID/Reference #: [id number]

## 2024-09-19 ENCOUNTER — Encounter: Payer: Self-pay | Admitting: Family Medicine

## 2024-09-19 DIAGNOSIS — K760 Fatty (change of) liver, not elsewhere classified: Secondary | ICD-10-CM

## 2024-09-19 DIAGNOSIS — E663 Overweight: Secondary | ICD-10-CM

## 2024-09-21 MED ORDER — ZEPBOUND 5 MG/0.5ML ~~LOC~~ SOAJ: 5.0000 mg | SUBCUTANEOUS | 0 refills | Status: AC

## 2024-09-21 MED ORDER — TIRZEPATIDE-WEIGHT MANAGEMENT 2.5 MG/0.5ML ~~LOC~~ SOLN: 2.5000 mg | SUBCUTANEOUS | 0 refills | Status: DC

## 2024-09-25 NOTE — Addendum Note (Signed)
 Addended byBETHA DUWAINE RIGGS on: 09/25/2024 09:56 AM   Modules accepted: Orders

## 2024-09-25 NOTE — Addendum Note (Signed)
 Addended by: BONNY JON DEL on: 09/25/2024 11:15 AM   Modules accepted: Orders

## 2024-09-25 NOTE — Addendum Note (Signed)
 Addended by: Val Schiavo D on: 09/25/2024 11:20 AM   Modules accepted: Orders

## 2024-10-30 ENCOUNTER — Ambulatory Visit: Payer: Self-pay

## 2024-10-30 NOTE — Telephone Encounter (Signed)
 FYI Only or Action Required?: FYI only for provider: appointment scheduled on 10/31/24.  Patient was last seen in primary care on 09/04/2024 by Alvan Dorothyann BIRCH, MD.  Called Nurse Triage reporting Urinary Frequency.  Symptoms began several days ago.  Interventions attempted: Other: hydration; water and cranberry juice.  Symptoms are: gradually worsening.  Triage Disposition: See Physician Within 24 Hours  Patient/caregiver understands and will follow disposition?:   Copied from CRM #[id number]. Topic: Clinical - Red Word Triage >> Oct 30, 2024  4:23 PM Mercer PEDLAR wrote: Red Word that prompted transfer to Nurse Triage: Possible UTI, frequent urination, burning with urination. Reason for Disposition  Urinating more frequently than usual (i.e., frequency) OR new-onset of the feeling of an urgent need to urinate (i.e., urgency)  Answer Assessment - Initial Assessment Questions 1. SYMPTOM: What's the main symptom you're concerned about? (e.g., frequency, incontinence)     Possible uti; pt reports having sexual intercourse then 2-3 days ago started to experience UTI like symptoms with urinary frequency and burning   2. ONSET: When did the  symptoms  start?     3 days ago   3. PAIN: Is there any pain? If Yes, ask: How bad is it? (Scale: 1-10; mild, moderate, severe)     No; more burning with urination  4. CAUSE: What do you think is causing the symptoms?     Thinks she has a UTI   5. OTHER SYMPTOMS: Do you have any other symptoms? (e.g., blood in urine, fever, flank pain, pain with urination)     None  Protocols used: Urinary Symptoms-A-AH

## 2024-10-30 NOTE — Telephone Encounter (Signed)
 Patient scheduled for 10/31/2024 with Dr. Alvan

## 2024-10-31 ENCOUNTER — Ambulatory Visit: Admitting: Family Medicine

## 2024-10-31 ENCOUNTER — Telehealth: Payer: Self-pay

## 2024-10-31 ENCOUNTER — Other Ambulatory Visit: Payer: Self-pay

## 2024-10-31 ENCOUNTER — Encounter: Payer: Self-pay | Admitting: Family Medicine

## 2024-10-31 VITALS — BP 124/71 | HR 86 | Ht 64.0 in | Wt 159.7 lb

## 2024-10-31 DIAGNOSIS — R350 Frequency of micturition: Secondary | ICD-10-CM

## 2024-10-31 DIAGNOSIS — N3000 Acute cystitis without hematuria: Secondary | ICD-10-CM | POA: Diagnosis not present

## 2024-10-31 DIAGNOSIS — R300 Dysuria: Secondary | ICD-10-CM | POA: Diagnosis not present

## 2024-10-31 DIAGNOSIS — R748 Abnormal levels of other serum enzymes: Secondary | ICD-10-CM

## 2024-10-31 LAB — POCT URINALYSIS DIP (CLINITEK)
Bilirubin, UA: NEGATIVE
Blood, UA: NEGATIVE
Glucose, UA: NEGATIVE mg/dL
Nitrite, UA: NEGATIVE
POC PROTEIN,UA: 100 — AB
Spec Grav, UA: 1.02 (ref 1.010–1.025)
Urobilinogen, UA: 0.2 U/dL
pH, UA: 7 (ref 5.0–8.0)

## 2024-10-31 MED ORDER — NITROFURANTOIN MONOHYD MACRO 100 MG PO CAPS: 100.0000 mg | ORAL_CAPSULE | Freq: Two times a day (BID) | ORAL | 0 refills | Status: DC

## 2024-10-31 NOTE — Telephone Encounter (Signed)
 Call received from [facility]  but caller was disconnected once I returned to the phone.  Was told by [facility] that was [facility] calling with a problem with the orders from October.   I attempted to return call to [facility] but no answer and had to leave a voice mail message  IN review of chart showing that CMP did not have a requisition attached so resubmitted this order.  Left this information on [facility] voice mail as well.

## 2024-10-31 NOTE — Addendum Note (Signed)
 Addended by: FREYA BASCOM CROME on: 10/31/2024 10:22 AM   Modules accepted: Orders

## 2024-10-31 NOTE — Progress Notes (Signed)
 Acute Office Visit  Patient ID: Sierra Mccullough, female    DOB: [birth year], 33 y.o.   MRN: [id number]  PCP: Alvan Dorothyann BIRCH, MD  Chief Complaint  Patient presents with   Urinary Frequency    Subjective:     HPI  Discussed the use of AI scribe software for clinical note transcription with the patient, who gave verbal consent to proceed.  History of Present Illness A 32 year old female presents with urinary symptoms.  Urinary symptoms - Onset approximately four days ago, around Thanksgiving - Dysuria present - Urine was mostly clear initially, became cloudy and concentrated this morning - No fever, chills, hematuria, or unusual odor to urine  Gastrointestinal symptoms - Mild nausea - No abdominal pain at present  Flank and back pain - No back pain  Antibiotic use and adverse reactions - No antibiotic use for any condition in the past sixty days - History of significant abdominal pain with previous use of Keflex    ROS     Objective:    BP 124/71   Pulse 86   Ht 5' 4 (1.626 m)   Wt 159 lb 11.2 oz (72.4 kg)   LMP 10/18/2024 (Approximate)   SpO2 100%   BMI 27.41 kg/m    Physical Exam Vitals reviewed.  Constitutional:      Appearance: Normal appearance.  HENT:     Head: Normocephalic.  Pulmonary:     Effort: Pulmonary effort is normal.  Abdominal:     Palpations: Abdomen is soft.     Tenderness: There is no abdominal tenderness.  Musculoskeletal:     Comments: NO CVA tenderness   Neurological:     Mental Status: She is alert and oriented to person, place, and time.  Psychiatric:        Mood and Affect: Mood normal.        Behavior: Behavior normal.       Results for orders placed or performed in visit on 10/31/24  POCT URINALYSIS DIP (CLINITEK)  Result Value Ref Range   Color, UA yellow yellow   Clarity, UA cloudy (A) clear   Glucose, UA negative negative mg/dL   Bilirubin, UA negative negative   Ketones, POC UA small (15) (A)  negative mg/dL   Spec Grav, UA 8.979 8.989 - 1.025   Blood, UA negative negative   pH, UA 7.0 5.0 - 8.0   POC PROTEIN,UA =100 (A) negative, trace   Urobilinogen, UA 0.2 0.2 or 1.0 E.U./dL   Nitrite, UA Negative Negative   Leukocytes, UA Small (1+) (A) Negative       Assessment & Plan:   Problem List Items Addressed This Visit   None Visit Diagnoses       Frequency of urination    -  Primary   Relevant Orders   POCT URINALYSIS DIP (CLINITEK) (Completed)   Urine Culture     Burning with urination       Relevant Orders   POCT URINALYSIS DIP (CLINITEK) (Completed)   Urine Culture     Acute cystitis without hematuria           Assessment and Plan Assessment & Plan Urinary tract infection symptoms (dysuria, frequency) Urinalysis indicated leukocytes and protein, consistent with UTI. No recent antibiotic use. History of adverse reaction to Keflex . - Prescribed nitrofurantoin  (Macrobid ) for 5 days, twice daily. - Advised to maintain adequate fluid intake. - Instructed to report any changes in symptoms.    Meds ordered this encounter  Medications   nitrofurantoin , macrocrystal-monohydrate, (MACROBID ) 100 MG capsule    Sig: Take 1 capsule (100 mg total) by mouth 2 (two) times daily.    Dispense:  10 capsule    Refill:  0    No follow-ups on file.  Dorothyann Byars, MD [facility] at [facility]

## 2024-11-06 LAB — HM PAP SMEAR

## 2024-12-10 ENCOUNTER — Ambulatory Visit: Admitting: Family Medicine

## 2024-12-10 ENCOUNTER — Encounter: Payer: Self-pay | Admitting: Family Medicine

## 2024-12-10 VITALS — BP 104/65 | HR 90 | Ht 64.0 in | Wt 152.0 lb

## 2024-12-10 DIAGNOSIS — R748 Abnormal levels of other serum enzymes: Secondary | ICD-10-CM | POA: Diagnosis not present

## 2024-12-10 DIAGNOSIS — F1021 Alcohol dependence, in remission: Secondary | ICD-10-CM | POA: Diagnosis not present

## 2024-12-10 DIAGNOSIS — Z87898 Personal history of other specified conditions: Secondary | ICD-10-CM

## 2024-12-10 DIAGNOSIS — R7301 Impaired fasting glucose: Secondary | ICD-10-CM | POA: Diagnosis not present

## 2024-12-10 DIAGNOSIS — E785 Hyperlipidemia, unspecified: Secondary | ICD-10-CM

## 2024-12-10 DIAGNOSIS — E282 Polycystic ovarian syndrome: Secondary | ICD-10-CM

## 2024-12-10 LAB — POCT GLYCOSYLATED HEMOGLOBIN (HGB A1C): Hemoglobin A1C: 5.3 % (ref 4.0–5.6)

## 2024-12-10 NOTE — Assessment & Plan Note (Signed)
" °  Fatty liver/elevated liver enyzmes  Previous abnormal liver enzymes. Considering rechecking liver enzymes. Engaging in lifestyle modifications. - Ordered liver function tests to be drawn in 3-4 weeks. - Encouraged continued lifestyle modifications, including diet and exercise. "

## 2024-12-10 NOTE — Assessment & Plan Note (Signed)
 Impaired fasting glucose A1c at 5.3, well-controlled. Tolerating Zepbound  5 mg well. - Continue Zepbound  5 mg as tolerated. - Monitor A1c levels regularly.

## 2024-12-10 NOTE — Progress Notes (Signed)
 "  Established Patient Office Visit  Patient ID: Sierra Mccullough, female    DOB: June 08, 1991  Age: 34 y.o. MRN: [id number] PCP: Alvan Dorothyann BIRCH, MD  Chief Complaint  Patient presents with   Medical Management of Chronic Issues    Prediabetic - check liver enzymes    Subjective:     HPI  Discussed the use of AI scribe software for clinical note transcription with the patient, who gave verbal consent to proceed.  History of Present Illness Sierra Mccullough is a 34 year old female who presents for follow-up on her diabetes management and medication tolerance.  Diabetes mellitus management - Currently taking Zepbound  5 mg daily for approximately one month, working with [employer]  - Initially experienced nausea with overeating, now tolerating medication without issues - Missed one week of medication due to insurance change, has since resumed therapy - A1c is 5.3 - Manages diet by prioritizing protein and vegetables over carbohydrates to maintain muscle mass and manage hunger  Liver enzyme and lipid abnormalities - Concerned about previously elevated liver enzymes and cholesterol - Has been on Zepbound  for a couple of months - Engaged in dietary modifications and exercise to address metabolic health - Recently joined a fitness center with her mother  Substance use and mental health - Sober for two years as of February 11th - Actively working with a therapist - Reports significant personal growth over the past two years     ROS    Objective:     BP 104/65 (BP Location: Left Arm, Patient Position: Sitting, Cuff Size: Normal)   Pulse 90   Ht 5' 4 (1.626 m)   Wt 152 lb (68.9 kg)   SpO2 100%   BMI 26.09 kg/m    Physical Exam Vitals and nursing note reviewed.  Constitutional:      Appearance: Normal appearance.  HENT:     Head: Normocephalic and atraumatic.  Eyes:     Conjunctiva/sclera: Conjunctivae normal.  Cardiovascular:     Rate and Rhythm: Normal rate  and regular rhythm.  Pulmonary:     Effort: Pulmonary effort is normal.     Breath sounds: Normal breath sounds.  Skin:    General: Skin is warm and dry.  Neurological:     Mental Status: She is alert.  Psychiatric:        Mood and Affect: Mood normal.      Results for orders placed or performed in visit on 12/10/24  HM PAP SMEAR  Result Value Ref Range   HM Pap smear SEE REPORT   POCT glycosylated hemoglobin (Hb A1C)  Result Value Ref Range   Hemoglobin A1C 5.3 4.0 - 5.6 %   HbA1c POC (<> result, manual entry)     HbA1c, POC (prediabetic range)     HbA1c, POC (controlled diabetic range)        The ASCVD Risk score (Arnett DK, et al., 2019) failed to calculate for the following reasons:   The 2019 ASCVD risk score is only valid for ages 50 to 104    Assessment & Plan:   Problem List Items Addressed This Visit       Endocrine   PCOS (polycystic ovarian syndrome)   Relevant Orders   CMP14+EGFR   Lipid panel   IFG (impaired fasting glucose) - Primary   Impaired fasting glucose A1c at 5.3, well-controlled. Tolerating Zepbound  5 mg well. - Continue Zepbound  5 mg as tolerated. - Monitor A1c levels regularly.  Relevant Orders   CMP14+EGFR   Lipid panel     Other   Hyperlipidemia   History of alcohol dependence (HCC)   Elevated liver enzymes    Fatty liver/elevated liver enyzmes  Previous abnormal liver enzymes. Considering rechecking liver enzymes. Engaging in lifestyle modifications. - Ordered liver function tests to be drawn in 3-4 weeks. - Encouraged continued lifestyle modifications, including diet and exercise.      Relevant Orders   CMP14+EGFR   Lipid panel   Other Visit Diagnoses       History of prediabetes       Relevant Orders   POCT glycosylated hemoglobin (Hb A1C) (Completed)       Assessment and Plan Assessment & Plan  Weight management On Zepbound  5 mg, engaging in dietary modifications and exercise. - Continue current weight  management plan with [facility]. - Encouraged dietary modifications and regular exercise.  Hyperlipidemia Previous elevated cholesterol levels. Considering rechecking lipid levels after lifestyle modifications. - Ordered lipid panel to be drawn in 3-4 weeks. - Encouraged fasting before lipid panel for accurate results.  Alcohol dependence, in remission Sober for two years, working with a paramedic. - Continue therapy and support for sobriety.  General health maintenance Up to date on Pap smear screening. - Will obtain a copy of the Pap smear results for the medical record.    No follow-ups on file.    Dorothyann Byars, MD [facility] at [facility]   "
